# Patient Record
Sex: Male | Born: 1950 | Race: Black or African American | Hispanic: No | Marital: Married | Smoking: Former smoker
Health system: Southern US, Community
[De-identification: ages and names within clinical notes are randomized; demographics above are authoritative.]

## PROBLEM LIST (undated history)

## (undated) DIAGNOSIS — J329 Chronic sinusitis, unspecified: Secondary | ICD-10-CM

## (undated) DIAGNOSIS — Z950 Presence of cardiac pacemaker: Secondary | ICD-10-CM

## (undated) DIAGNOSIS — K219 Gastro-esophageal reflux disease without esophagitis: Secondary | ICD-10-CM

## (undated) DIAGNOSIS — I2510 Atherosclerotic heart disease of native coronary artery without angina pectoris: Secondary | ICD-10-CM

## (undated) DIAGNOSIS — I498 Other specified cardiac arrhythmias: Secondary | ICD-10-CM

## (undated) DIAGNOSIS — I4891 Unspecified atrial fibrillation: Secondary | ICD-10-CM

## (undated) DIAGNOSIS — I10 Essential (primary) hypertension: Secondary | ICD-10-CM

## (undated) DIAGNOSIS — M1990 Unspecified osteoarthritis, unspecified site: Secondary | ICD-10-CM

## (undated) DIAGNOSIS — I429 Cardiomyopathy, unspecified: Secondary | ICD-10-CM

## (undated) DIAGNOSIS — R112 Nausea with vomiting, unspecified: Secondary | ICD-10-CM

## (undated) DIAGNOSIS — I509 Heart failure, unspecified: Secondary | ICD-10-CM

## (undated) DIAGNOSIS — Z9889 Other specified postprocedural states: Secondary | ICD-10-CM

## (undated) DIAGNOSIS — E7800 Pure hypercholesterolemia, unspecified: Secondary | ICD-10-CM

## (undated) DIAGNOSIS — R011 Cardiac murmur, unspecified: Secondary | ICD-10-CM

## (undated) DIAGNOSIS — I456 Pre-excitation syndrome: Secondary | ICD-10-CM

## (undated) DIAGNOSIS — G43909 Migraine, unspecified, not intractable, without status migrainosus: Secondary | ICD-10-CM

## (undated) HISTORY — DX: Atherosclerotic heart disease of native coronary artery without angina pectoris: I25.10

## (undated) HISTORY — DX: Chronic sinusitis, unspecified: J32.9

## (undated) HISTORY — PX: ATRIAL FIBRILLATION ABLATION: EP1191

## (undated) HISTORY — DX: Unspecified atrial fibrillation: I48.91

## (undated) HISTORY — DX: Other specified cardiac arrhythmias: I49.8

## (undated) HISTORY — DX: Essential (primary) hypertension: I10

## (undated) HISTORY — PX: SHOULDER ARTHROSCOPY WITH ROTATOR CUFF REPAIR: SHX5685

## (undated) HISTORY — DX: Pre-excitation syndrome: I45.6

## (undated) HISTORY — PX: CARDIAC CATHETERIZATION: SHX172

## (undated) HISTORY — DX: Cardiomyopathy, unspecified: I42.9

---

## 2003-09-23 ENCOUNTER — Inpatient Hospital Stay (HOSPITAL_COMMUNITY): Admission: EM | Admit: 2003-09-23 | Discharge: 2003-09-25 | Payer: Self-pay | Admitting: *Deleted

## 2005-06-19 ENCOUNTER — Ambulatory Visit: Payer: Self-pay | Admitting: Internal Medicine

## 2005-06-19 ENCOUNTER — Inpatient Hospital Stay (HOSPITAL_COMMUNITY): Admission: AD | Admit: 2005-06-19 | Discharge: 2005-06-27 | Payer: Self-pay | Admitting: Internal Medicine

## 2005-06-19 HISTORY — PX: CARDIOVERSION: SHX1299

## 2005-07-17 ENCOUNTER — Ambulatory Visit: Payer: Self-pay | Admitting: Internal Medicine

## 2005-10-01 ENCOUNTER — Ambulatory Visit: Payer: Self-pay | Admitting: Internal Medicine

## 2006-01-15 ENCOUNTER — Ambulatory Visit: Payer: Self-pay | Admitting: Internal Medicine

## 2006-08-09 ENCOUNTER — Emergency Department (HOSPITAL_COMMUNITY): Admission: EM | Admit: 2006-08-09 | Discharge: 2006-08-09 | Payer: Self-pay | Admitting: Emergency Medicine

## 2006-08-10 ENCOUNTER — Ambulatory Visit: Payer: Self-pay | Admitting: Internal Medicine

## 2006-08-14 ENCOUNTER — Ambulatory Visit: Admission: RE | Admit: 2006-08-14 | Discharge: 2006-08-14 | Payer: Self-pay | Admitting: Internal Medicine

## 2006-12-17 ENCOUNTER — Ambulatory Visit: Payer: Self-pay | Admitting: Internal Medicine

## 2007-06-30 ENCOUNTER — Ambulatory Visit: Payer: Self-pay | Admitting: Internal Medicine

## 2008-06-06 DIAGNOSIS — I429 Cardiomyopathy, unspecified: Secondary | ICD-10-CM

## 2008-06-06 DIAGNOSIS — I498 Other specified cardiac arrhythmias: Secondary | ICD-10-CM | POA: Insufficient documentation

## 2008-06-06 DIAGNOSIS — I10 Essential (primary) hypertension: Secondary | ICD-10-CM

## 2008-06-06 DIAGNOSIS — J329 Chronic sinusitis, unspecified: Secondary | ICD-10-CM

## 2008-06-06 HISTORY — DX: Essential (primary) hypertension: I10

## 2008-06-06 HISTORY — DX: Chronic sinusitis, unspecified: J32.9

## 2008-06-06 HISTORY — DX: Other specified cardiac arrhythmias: I49.8

## 2008-06-06 HISTORY — DX: Cardiomyopathy, unspecified: I42.9

## 2008-06-26 ENCOUNTER — Ambulatory Visit: Payer: Self-pay | Admitting: Internal Medicine

## 2008-06-26 DIAGNOSIS — I4891 Unspecified atrial fibrillation: Secondary | ICD-10-CM

## 2008-06-26 DIAGNOSIS — I456 Pre-excitation syndrome: Secondary | ICD-10-CM | POA: Insufficient documentation

## 2008-06-26 HISTORY — DX: Pre-excitation syndrome: I45.6

## 2008-06-26 HISTORY — DX: Unspecified atrial fibrillation: I48.91

## 2008-12-14 ENCOUNTER — Ambulatory Visit: Payer: Self-pay | Admitting: Internal Medicine

## 2009-08-01 ENCOUNTER — Ambulatory Visit: Payer: Self-pay | Admitting: Internal Medicine

## 2010-02-13 NOTE — Assessment & Plan Note (Signed)
Summary: rov/gd   Visit Type:  Follow-up Primary Provider:  Minna Antis MD   History of Present Illness: [name] [name] returns today for followup.  He is a pleasant 60 yo man with a h/o SVT, atrial flutter and atrial fibrillation who also has WPW syndrome and has undergone multiple episodes of catheter ablation in [place].  He had persistant atrial fibrillation and a tachycardia induced cardiomyopathy but has done very nicely on amiodarone.  His myopathy has resolved and his CHF is class 1-2.  He still has occaisional episodes of palpitations.  No syncope.  He rarely get dyspnea with exertion which resolves with rest.  No cough.  He continues to be bothered by acid reflux symptoms. 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  Current Medications (verified): 1)  Amiodarone Hcl 200 Mg Tabs (Amiodarone Hcl) .... Take One Tablet By Mouth Mon-Fri and 1/2 Tab Sat and Sun 2)  Coreg Cr 20 Mg Xr24h-Cap (Carvedilol Phosphate) .Marland Kitchen.. 1 By Mouth Once Daily 3)  Furosemide 40 Mg Tabs (Furosemide) .... Take One Tablet By Mouth Two Times A Day . 4)  Potassium Chloride Crys Cr 20 Meq Cr-Tabs (Potassium Chloride Crys Cr) .... Take One Tablet By Mouth Daily 5)  Coumadin 6 Mg Tabs (Warfarin Sodium) .... As Directed 6)  Aciphex 20 Mg Tbec (Rabeprazole Sodium) .Marland Kitchen.. 1 By Mouth Once Daily 7)  Cialis .... As Needed 8)  Ativan 0.5 Mg Tabs (Lorazepam) .... Uad 9)  Diovan 160 Mg Tabs (Valsartan) .... Take One Tablet By Mouth Daily  Allergies (verified): No Known Drug Allergies  Past History:  Past Medical History: Last updated: 06/06/2008 ? of RENAL INSUFFICIENCY (ICD-588.9) SINUSITIS (ICD-473.9) HYPERTENSION (ICD-401.9) BRADYCARDIA (ICD-427.89) CARDIOMYOPATHY, SECONDARY (ICD-425.9)  Past Surgical History: Last updated: 12/13/2008  DC cardioversion   DATE OF PROCEDURE:  10/02/2008   PHYSICIAN:  Doylene Canning. Ladona Ridgel, MD    PROCEDURE PERFORMED:  Insertion of a single-chamber defibrillator.    DATE OF PROCEDURE:  08/03/2008   PHYSICIAN:  Quita Skye. Hart Rochester, M.D.  OPERATION:  Left femoral to right femoral bypass using an 8-mm   Hemashield Dacron graft.      DATE OF PROCEDURE:  06/01/2008   PHYSICIAN:  Corky Crafts, MD  PROCEDURES PERFORMED:   1. Abdominal aortogram.   2. Bilateral lower extremity runoff.   3. Pelvic angiogram.      DATE OF PROCEDURE: 07/24/2005  PHYSICIAN:  Georgiana Spinner, M.D.   PROCEDURE:  Upper endoscopy with foreign body removal.  Review of Systems  The patient denies chest pain, syncope, dyspnea on exertion, and peripheral edema.    Vital Signs:  Patient profile:   60 year old male Height:      71 inches Weight:      210 pounds BMI:     29.39  Vitals Entered By: Laurance Flatten CMA (August 01, 2009 1:59 PM)  Serial Vital Signs/Assessments:  Time      Position  BP       Pulse  Resp  Temp     By           Lying RA  140/76                         Jewel Hardy CMA           Sitting   130/80  Jewel Hardy CMA           Standing  148/80                         Laurance Flatten CMA   Physical Exam  General:  Well developed, well nourished, in no acute distress. Head:  normocephalic and atraumatic Eyes:  PERRLA/EOM intact; conjunctiva and lids normal. Mouth:  Teeth, gums and palate normal. Oral mucosa normal. Neck:  Neck supple, no JVD. No masses, thyromegaly or abnormal cervical nodes. Lungs:  Clear bilaterally to auscultation without wheezes, rales, or rhonchi. Heart:  RRR with normal S1 and S2.  PMI is not enlarged or laterally displaced. No murmur. Abdomen:  Bowel sounds positive; abdomen soft and non-tender without masses, organomegaly, or hernias noted. No hepatosplenomegaly. Msk:  Back normal, normal gait. Muscle strength and tone normal. Pulses:  pulses normal in all 4 extremities Extremities:  No clubbing or cyanosis. Neurologic:  Alert and oriented x  3.   EKG  Procedure date:  08/01/2009  Findings:      Sinus bradycardia with rate of:  55.  Impression & Recommendations:  Problem # 1:  ATRIAL FIBRILLATION (ICD-427.31) His symptoms remain well controlled.  He is in NSR.  I will ask him to followup with [place] in several months. We will further reduce his coumadin dose. His updated medication list for this problem includes:    Amiodarone Hcl 200 Mg Tabs (Amiodarone hcl) .Marland Kitchen... Take one tablet by mouth mon-thurs and 1/2 tab fri- sun    Coreg Cr 20 Mg Xr24h-cap (Carvedilol phosphate) .Marland Kitchen... 1 by mouth once daily    Coumadin 6 Mg Tabs (Warfarin sodium) .Marland Kitchen... As directed  Problem # 2:  WOLFF (WOLFE)-PARKINSON-WHITE (WPW) SYNDROME (ICD-426.7)  No recurrent symptoms on amiodarone. His updated medication list for this problem includes:    Amiodarone Hcl 200 Mg Tabs (Amiodarone hcl) .Marland Kitchen... Take one tablet by mouth mon-fri and 1/2 tab sat and sun    Coreg Cr 20 Mg Xr24h-cap (Carvedilol phosphate) .Marland Kitchen... 1 by mouth once daily    Coumadin 6 Mg Tabs (Warfarin sodium) .Marland Kitchen... As directed  Problem # 3:  CARDIOMYOPATHY, SECONDARY (ICD-425.9) His symptoms remain well controlled.  Continue meds as below His updated medication list for this problem includes:    Amiodarone Hcl 200 Mg Tabs (Amiodarone hcl) .Marland Kitchen... Take one tablet by mouth mon-thurs and 1/2 tab fri- sun    Coreg Cr 20 Mg Xr24h-cap (Carvedilol phosphate) .Marland Kitchen... 1 by mouth once daily    Furosemide 40 Mg Tabs (Furosemide) .Marland Kitchen... Take one tablet by mouth two times a day .    Coumadin 6 Mg Tabs (Warfarin sodium) .Marland Kitchen... As directed    Diovan 160 Mg Tabs (Valsartan) .Marland Kitchen... Take one tablet by mouth daily  Patient Instructions: 1)  Your physician recommends that you schedule a follow-up appointment in: 12 months with Dr Ladona Ridgel 2)  Your physician has recommended you make the following change in your medication: decrease your Amiodarone to 1/2 tablet Fri-Sun, and one tablet mon-Thurs

## 2010-05-28 NOTE — Assessment & Plan Note (Signed)
[facility]                         ELECTROPHYSIOLOGY OFFICE NOTE   NAME:[name] [name], [name] [name]                     MRN:          [id number]  DATE:06/30/2007                            DOB:          17-Jan-1950    [name] returns today for a followup.  He is a pleasant, middle-aged  male with a history of WPW syndrome status post multiple ablation  attempts and history of atrial arrhythmias, who has been maintained very  nicely in sinus rhythm with no evidence of accessory pathway conduction,  on amiodarone therapy.  He returns today for a followup.  He notes that  he has rare palpitations, which occur when he drinks cold water, but  otherwise no sustained arrhythmias.  He denies chest pain or shortness  of breath.   MEDICATIONS:  1. Amiodarone 200 mg daily.  2. Aciphex 20 a day.  3. Furosemide 40 twice a day.  4. Potassium supplement.  5. Coreg CR 40 a day.  6. Ramipril 5 a day.  7. Jantoven 6 mg daily.   PHYSICAL EXAMINATION:  GENERAL:  This is a pleasant, well-appearing  middle-aged man, in no distress.  VITAL SIGNS:  Blood pressure 138/80, the pulse is 52 and regular,  respirations is 18, the weight is 216 pounds.  NECK:  No jugular distention.  LUNGS:  Clear bilaterally to auscultation.  No wheezes, rales, or  rhonchi are present.  CARDIAC:  Regular bradycardia.  Normal S1 and S2.  ABDOMEN:  Soft, nontender, and nondistended.  No organomegaly.  EXTREMITIES:  No edema.   EKG today demonstrates sinus bradycardia with first-degree AV block and  left axis deviation.   IMPRESSION:  1. Cardiomyopathy secondary to symptomatic Wolff-Parkinson-White      syndrome, now resolved.  2. Chronic amiodarone therapy.  3. Sinus bradycardia.  4. Hypertension.   DISCUSSION:  [name] is stable.  I have recommended that he continue  on his current dose of amiodarone, and we will see him back in the  office in 1 year.     Doylene Canning. Ladona Ridgel, MD  Electronically Signed    GWT/MedQ  DD: 06/30/2007  DT: 07/01/2007  Job #: [id number]

## 2010-05-28 NOTE — Assessment & Plan Note (Signed)
[facility]                         ELECTROPHYSIOLOGY OFFICE NOTE   NAME:[name] [name], [name] [name]                     MRN:          [id number]  DATE:12/17/2006                            DOB:          1950-02-10    [name] returns today for follow-up.  He is a very pleasant middle-  aged man with a history of WPW syndrome, status post multiple failed  ablations, with a history of atrial fibrillation and atrial flutter,  status post amiodarone therapy.  The patient returns today for follow-  up.  He has had very minimal, if any, palpitations in the last 6 months  since we saw him.  He has been under increasing stress but otherwise has  been stable.   EXAM:  He is a pleasant, well-appearing middle-aged man in no acute  distress.  Blood pressure 138/83, the pulse 67 and regular, respirations were 18,  the weight was 219 pounds.  NECK:  No jugular venous distention.  LUNGS:  Clear bilaterally to auscultation.  No wheezes, rales or rhonchi  were present.  CARDIOVASCULAR:  A regular rate and rhythm with normal S1, S2.  EXTREMITIES:  No edema.   MEDICATIONS:  1. Furosemide 40 mg twice a day.  2. Coumadin 6 mg as directed.  3. Trandolapril, which has been stopped.  4. Aciphex 20 mg daily.  5. Amiodarone 200 mg.  6. Coreg CR 40 mg daily.  7. Micardis 40 mg daily.  8. Potassium 20 mEq daily.   IMPRESSION:  1. Wolff-Parkinson-White syndrome.  2. Recurrent atrial fibrillation.  3. Atrial flutter.  4. Amiodarone therapy controlling all of the above.   DISCUSSION:  [name] is doing well.  His EKG today demonstrates no  delta wave or evidence of pre-excitation.  He will see [place] back in the  office in 6 months.  At that time we may consider additional decrement  of his amiodarone dosing down to 200 mg daily.     Doylene Canning. Ladona Ridgel, MD  Electronically Signed    GWT/MedQ  DD: 12/17/2006  DT: 12/18/2006  Job #: [id number]   cc:   Minna Antis, MD  Dallie Dad, MD

## 2010-05-28 NOTE — Assessment & Plan Note (Signed)
[facility]                         ELECTROPHYSIOLOGY OFFICE NOTE   NAME:Lewelling, [name] [name]                     MRN:          [id number]  DATE:08/10/2006                            DOB:          [birth year]    [name] [name] returns today for followup.  He is a very pleasant middle-  aged man with WPW syndrome and atrial fibrillation which could not be  ablated.  The patient returns today for followup.  He notes that he has  occasional cough and does feel some fatigue and weakness.  Otherwise, he  had no specific complaints.  He denies chest pain.  He denies peripheral  edema.  He has very mild dyspnea with exertion and he notes an  occasional nonproductive cough.   PHYSICAL EXAM:  He is a pleasant, well-appearing middle-aged man in no  acute distress.  The blood pressure was 146/91, the pulse was 60 and  regular with respirations 16.  NECK:  No jugular venous distention.  LUNGS:  Clear bilaterally to auscultation.  No wheezes, rales or  rhonchi.  CARDIOVASCULAR EXAM:  A regular rate and rhythm with normal S1 and S2.  EXTREMITIES:  Demonstrated no edema.   An EKG from yesterday demonstrates sinus rhythm with normal axis and  intervals.  There is no ventricular preexcitation noted.   IMPRESSION:  1. Wolff-Parkinson-White syndrome.  2. Paroxysmal atrial arrhythmias.  3. Status post attempt at multiple catheter ablations.   DISCUSSION:  Overall the patient is stable.  His heart is maintaining  sinus rhythm very nicely.  He will continue on his amiodarone at 200 a  day along with Coreg 20 mg twice daily.  He will also continue on  Aciphex and trandolapril and Coumadin.  Will plan to see the patient  back in the office in approximately 4 months.     Doylene Canning. Ladona Ridgel, MD  Electronically Signed    GWT/MedQ  DD: 08/10/2006  DT: 08/11/2006  Job #: [id number]

## 2010-05-31 NOTE — Op Note (Signed)
[name] [name], [name] [name] NO.:  [id number]   MEDICAL RECORD NO.:  [id number]          PATIENT TYPE:  INP   LOCATION:  2915                         FACILITY:  [facility]   PHYSICIAN:  Doylene Canning. Ladona Ridgel, M.D.  DATE OF BIRTH:  26-Mar-1950   DATE OF PROCEDURE:  06/19/2005  DATE OF DISCHARGE:                                 OPERATIVE REPORT   PROCEDURE PERFORMED:  DC cardioversion.   INDICATIONS:  A fib with a rapid ventricular response secondary to WPW  syndrome with maximal ventricular pre-excitation.   HISTORY OF PRESENT ILLNESS:  The patient is a very pleasant middle-aged male  with a history of long longstanding WPW syndrome, atrial fibrillation and  atrial flutter who has had three catheter ablations for his A fib and WPW  syndrome.  He was recently hospitalized and underwent attempted catheter  ablation which was initially thought to be successful but with the patient  returning back to preexcitation and A fib with a rapid ventricular response.  The patient will was seen in our office today and was in atrial fibrillation  with a rapid ventricular response at 185 beats per minute with maximal  ventricular pre-excitation.  The closest coupling interval was approximately  220 msec. He is now referred for  DC cardioversion.  It is anticipated that  because of his difficult anatomy that he will be treated with medical  therapy to try to maintain him in sinus rhythm.   DESCRIPTION OF PROCEDURE:  After informed consent was obtained, the patient  was sedated in the CCU with sodium Pentothal.  200 joules of synchronized  biphasic energy were delivered through the electrode dispersive pads in the  anterior-posterior projection restoring sinus rhythm.  The patient tolerated  the procedure well.   COMPLICATIONS:  There were no immediate procedure complications.   RESULTS:  This demonstrates a successful DC cardioversion in a patient with  atrial fibrillation, a rapid atrial  response, and WPW syndrome with maximal  ventricular pre-excitation.           ______________________________  Doylene Canning. Ladona Ridgel, M.D.     GWT/MEDQ  D:  06/19/2005  T:  06/20/2005  Job:  [id number]   cc:   Dallie Dad, MD   Dr. Fabian November, [place], [place]

## 2010-05-31 NOTE — H&P (Signed)
NAME:  [name] [name], [name] [name] NO.:  [id number]   MEDICAL RECORD NO.:  [id number]                   PATIENT TYPE:  EMS   LOCATION:  MAJO                                 FACILITY:  [facility]   PHYSICIAN:  Vida Roller, M.D.                DATE OF BIRTH:  Jul 17, 1950   DATE OF ADMISSION:  09/23/2003  DATE OF DISCHARGE:                                HISTORY & PHYSICAL   PHYSICIANS:  Primary care physician:  Dr. Pleas Patricia, [facility]  Cardiologist:  Dr. Dallie Dad, [place], [place].   HISTORY OF PRESENT ILLNESS:  This is a 60 year old African-American  gentleman with a history of Wolff-Parkinson-white and history of atrial  fibrillation.  He presents with sudden onset of palpitations and shortness  of breath with dizziness at approximately 3 o'clock this morning.  He is  visiting here on a convention and presented to the emergency department here  at [facility], was found to be in atrial fibrillation with rapid  ventricular response, hemodynamically stable, and we were asked to evaluate  him.  He states that over the past two weeks he has felt kind of poorly and  had an upper respiratory infection.  His primary care Charrise Lardner put him on  amoxicillin and a decongestant medication called Furadex which appears to be  dextromethorphan and guaifenesin.  Also, two other medications that she does  not have with her, one of which was for dizziness.  The wife actually gave  this history.  He states that the onset of the symptoms was not associated  with any chest pain, diaphoresis, PND, or orthopnea.   PAST MEDICAL HISTORY:  Wolff-Parkinson-white.  He is status post a failed  ablation by the electrophysiology group at [facility] in  [place].  He stat4es that he had a structurally abnormal heart and that  they could not get to the area where the bypass track was likely to be and,  therefore, they abandoned the procedure.  He was  placed on verapamil and  flecainide and has taken those medications consistently for the last couple  of years without any difficulties.  He had an episode of atrial fibrillation  which brought him into prominence with the cardiology group.  He required DC  cardioversion in the past, and this is when they discovered his Evelene Croon-  Parkinson-white.  He is not anticoagulated.  He has a history of congenital  heart disease which is not well recorded, and I do not have records on this,  and he does not necessarily describe any particularly clear history of  abnormality other than he says his valves do not work normally.  He is not  limited in any exertionally, though.  He also has ectopic atrial rhythm  currently on EKG with a mild tachycardia.   He does not have any past surgical history.   ALLERGIES:  He is not allergic to any  medications.   SOCIAL HISTORY:  He does not smoke, drink, or use any illicit drugs.  He is  married.   FAMILY HISTORY:  Significant for his father dying of myocardial infarction  at a relatively young age.  He has also history of diabetes and hypertension  in his family.   REVIEW OF SYSTEMS:  Generally negative except for that reviewed in the  History of Present Illness.   PHYSICAL EXAMINATION:  GENERAL:  Well-developed, well-nourished African-  American male in no apparently distress.  He is alert and oriented x 4.  VITAL SIGNS:  Pulse 120, blood pressure 117/80, respiratory rate 18.  He is  afebrile.  HEAD, EYES, EARS, NOSE, AND THROAT:  Unremarkable.  NECK:  Supple.  There is no jugular venous distention or carotid bruits.  Thyroid is normal size and midline.  ABDOMEN: Soft.  CHEST:  Clear to auscultation.  CARDIAC:  Tachycardic but regular with no obvious murmur.  His point of  maximal impulse is not displaced, and he has no lifts or thrills.  EXTREMITIES:  Lower extremities are without significant clubbing, cyanosis,  or edema.  His pulses are 2+  throughout.   LABORATORY AND X-RAY DATA:  He has a bunch of electrocardiograms, the most  recent of which shows an ectopic atrial tachycardia at a rate of 123 with a  PR interval of 144 msec, QRS duration 90 msec, QT corrected at 460 msec.  He  has electrocardiographic evidence of left ventricular hypertrophy.  The P  wave morphology is negative in leads II, III, and F, but positive in lead I.  He has no obvious delta wave, although there is some up slurring of the  lateral precordial leads.  He previously had EKGs which showed him in atrial  fibrillation.   Laboratories are pending.   Chest x-ray is pending.   IMPRESSION:  This is a gentleman with a complex electrophysiology problem,  currently on two medications.  I have discussed this gentleman's case with  Dr. Lewayne Bunting to come up with a plan.  He recommends that we not  anticoagulate the patient at this time, that we slow his heart rate down  with some IV beta blocker, that we continue his flecainide and verapamil as  previously, and follow him over a 24-hour period.  If he converts to sinus  rhythm, then the plan would be to discharge him back to the care of his  cardiologist.  If he does not convert, the consideration may be made for DC  cardioversion versus consideration for an EP evaluation.                                                Vida Roller, M.D.    JH/MEDQ  D:  09/23/2003  T:  09/23/2003  Job:  [id number]   cc:   Dr. Pleas Patricia, [place]   Dallie Dad, M.D.  [place], [place]

## 2010-05-31 NOTE — Assessment & Plan Note (Signed)
[facility]                         ELECTROPHYSIOLOGY OFFICE NOTE   NAME:[name] [name], [name] [name]                     MRN:          [id number]  DATE:01/15/2006                            DOB:          14-May-1950    Mr. Holzman returns today for followup.  He is a very pleasant, middle-  aged man with WPW syndrome, atrial fibrillation, atrial flutter status  post multiple ablations.  He also has hypertension.  The patient has  done very nicely over the last several months on amiodarone therapy.  We  initially had him on 400 mg daily and decreased him to 200 mg a day back  in the summer.  He notes rare episodes of palpitations none of which  have been sustained.  His shortness of breath has resolved.  He does  have occasional abdominal bloating but ascribes this to be due to  anxiousness and nervousness.  His medications include potassium  furosemide, Coumadin, trandolapril, amiodarone, 200 a day, Coreg CR 20 a  day.   PHYSICAL EXAMINATION:  On exam he is a pleasant, well-appearing, middle-  aged man in no acute distress.  His blood pressure today was 138/84, the  pulse was 58 and regular. The respirations were 18, the weight was 221  pounds.  NECK:  Revealed no jugular venous distention.  LUNGS:  Clear bilaterally to auscultation and no wheezes, rales or  rhonchi.  CARDIOVASCULAR EXAM:  Revealed a respiratory rate with normal S1 and S2.  EXTREMITIES:  Demonstrated no edema.   The EKG demonstrates sinus rhythm with first degree AV block.  There is  LVH, but no evidence of any pre-excitation.   IMPRESSION:  1. Wolff-Parkinson-White syndrome.  2. Tachycardia induced cardiomyopathy, not resolved.  3. Atrial fibrillation and flutter.  4. Amiodarone therapy.  5. Chronic Coumadin therapy.   DISCUSSION:  Today, while I had initially had hoped to decrease Mr.  Limbaugh, amiodarone further, I have recommended that he continue on 200  mg daily as it appears to  be controlling his arrhythmia very nicely.  The patient has been on Coreg CR and I have given him a prescription to  switch him from Coreg CR to Coreg 12.5 twice a day in the generic  version secondary to cost.  I will plan to see him back in approximately  6 months for additional arrhythmia followup.     Doylene Canning. Ladona Ridgel, MD  Electronically Signed   GWT/MedQ  DD: 01/15/2006  DT: 01/15/2006  Job #: [id number]   cc:   Minna Antis, MD  Dallie Dad, MD

## 2010-05-31 NOTE — Discharge Summary (Signed)
[name] [name], [name] [name] NO.:  [id number]   MEDICAL RECORD NO.:  [id number]          PATIENT TYPE:  INP   LOCATION:  3714                         FACILITY:  [facility]   PHYSICIAN:  Maple Mirza, P.A. DATE OF BIRTH:  1950/03/23   DATE OF ADMISSION:  06/19/2005  DATE OF DISCHARGE:  06/27/2005                                 DISCHARGE SUMMARY   ALLERGIES:  DIGOXIN/CARDIZEM/PROTONIX.   PRINCIPAL DIAGNOSES:  1.  Admitted with atrial fibrillation rapid ventricular rate and concurrent      decompensation of congestive heart failure.      1.  Aggressive intravenous Lasix diuresis this admission.  2.  Severe congestive heart failure.      1.  Echocardiogram with ejection fraction 20%.      2.  Moderate mitral regurgitation.  Mitral valve area is 0.33 square          centimeters.      3.  Inferior and posterior walls are akinetic, lateral wall hypokinetic.  3.  When compensated, the patient has class II congestive heart failure.  4.  Status post DC cardioversion June 19, 2005 with relapse into paroxysmal      atrial fibrillation.  5.  Amiodarone therapy initiated this admission.      1.  Paroxysmal atrial fibrillation extinguished by day six after          initiation of amiodarone.  6.  Readjusted Coumadin dose this admission to account for amiodarone.  7.  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  8.  Cellulitis in left arm at infiltration of IV.  Oral antibiotics x8 days.   SECONDARY DIAGNOSES:  1.  Wolff-Parkinson-White status post failed ambulation attempts.  2.  Chronic nausea and gastrointestinal distress.   PLAN:  1.  Home on Lasix 40 mg b.i.d.  2.  Home on amiodarone with taper.  3.  Home on Keflex 500 mg q.i.d. x8 days.  4.  Starting Coreg 6.25 mg b.i.d. for severe cardiomyopathy of unclear      etiology.  5.  Mavik 1 mg daily for congestive heart failure.  6.  Followup office visit with Dr. Ladona Ridgel Thursday, July 5 at 10:45 with      BMET.  7.   Followup echocardiograms to watch for a rebound in ejection fraction.  8.  Have requested previous echocardiograms from [facility] and      other information relevant to this case.   PROCEDURE:  On admission, June 19, 2005, DC cardioversion with early relapse  into paroxysmal atrial fibrillation.   BRIEF HISTORY:  [name] [name] is a 60 year old male.  He has longstanding WPW  syndrome.  He has atrial fibrillation and atrial flutter.  He has had three  catheter ablations for his atrial fib and WPW syndrome.  He was recently  hospitalized and catheter ablation was attempted.  Initially this was felt  to be successful and the patient is returned back to pre-excitation and also  with atrial fibrillation with rapid ventricular response.  The patient was  seen in the office by Dr. Lewayne Bunting on June 7.  He was in  atrial  fibrillation with rapid response with maximal ventricular pre-excitation.  He is referred now for DC cardioversion.  In addition, medical therapy will  be instituted to maintain sinus rhythm.   HOSPITAL COURSE:  The patient presents June 7 for DC cardioversion.  This  was performed by Dr. Lewayne Bunting.  The patient did convert to sinus rhythm,  but reverted to paroxysms which were rather frequent of atrial fibrillation  within 48 hours.  The patient had a 2D echocardiogram on June 20, 2005.  This  study showed an ejection fraction of 20%.  This is rather surprising.  He  had moderate mitral valvular regurgitation.  Left atrium was dilated.  Right  ventricle moderately dilated.  The inferior and posterior walls are  akinetic.  The lateral wall shows marked hypokinesis.  The patient was  started on amiodarone therapy, both IV and p.o. on admission and throughout  this hospitalization we could actually document that his paroxysms of atrial  fibrillation have slowed over each 24 hour period until they finally were  extinguished by hospital day #7 as amiodarone completed  its loading.  During  this hospitalization, the patient did demonstrate overt decompensation of  congestive heart failure, not so much in breathing, but in the lower  extremities.  It was thought actually that the lower extremities were so  swollen and painful that perhaps he was having a DVT, although his Coumadin  was also supratherapeutic to therapeutic.  The patient improved rapidly  after institution of IV Lasix 40 mg b.i.d. and to such an extent that  swelling in the extremities has now subsided and symptoms of pain and  distention that the patient has been experiencing are now gone by the time  of discharge.  Because of amiodarone therapy, his Coumadin dose has been  revised.  Prior to this admission, he was on 10 mg Monday, Wednesday,  Friday, and 7.5 mg every other day.  At the time of discharge, he will be on  7.5 mg daily.  The patient did develop a left upper extremity cellulitis  after infiltration of IV and he has been treated with IV Ancef and will go  home on oral Keflex medication.  Because of the overt evidence of congestive  heart failure and decompensation and the evidence by echocardiogram which  points to a possible tachycardia mediated etiology.  The patient's  medications have been adjusted.   DISCHARGE MEDICATIONS:  1.  Lasix 40 mg twice daily.  2.  Potassium chloride 40 mEq daily.  These are both new.  3.  Amiodarone.  This is new, 200 mg tabs.  Two tabs in the morning and two      tabs in the evening from June 15 to June 29.  Two tabs daily from June      30 to July 14, and then 1-1/2 tablets daily starting July 15.  4.  Coumadin 7.5 mg daily or as directed by [facility].  5.  AcipHex 20 mg daily.  6.  Keflex 500 mg four times daily x8 days.  He is to take this 1/2 hour      before breakfast, lunch, dinner, and bedtime.  7.  Coreg 6.25 mg twice daily.  This is a new medication.  8.  Mavik 1 mg daily.  This is new.  He is to present to the [facility] on Monday for prothrombin  time, and he will see Dr. Ladona Ridgel at [facility] [address]  [place]  Thursday, July 17, 2005 at 10:45.  Once again medical records have been  requested from [facility], especially any prior echocardiograms  and electrocardiograms.  The patient is to weight himself daily and to get  in touch with [facility] if he gains 3 pounds in two days.   This takes greater than 40 minutes to prepare and dictate.      Maple Mirza, P.A.     GM/MEDQ  D:  06/27/2005  T:  06/27/2005  Job:  [id number]   cc:   Doylene Canning. Ladona Ridgel, M.D.  [address]  [address]  [place]  [place] [address]   [facility]  Fax:  [phone]   Minna Antis, M.D.

## 2010-05-31 NOTE — Discharge Summary (Signed)
NAMEFINLEE, MILO NO.:  [id number]   MEDICAL RECORD NO.:  [id number]          PATIENT TYPE:  INP   LOCATION:  3714                         FACILITY:  [facility]   PHYSICIAN:  Maple Mirza, P.A. DATE OF BIRTH:  Jun 03, 1950   DATE OF ADMISSION:  06/19/2005  DATE OF DISCHARGE:  06/27/2005                                 DISCHARGE SUMMARY   ADDENDUM:   LABORATORY STUDIES:  Complete blood count on June 25, 2005: Hemoglobin 14.9,  hematocrit 43.9, white cells 8.8, platelets 300.  Serum electrolytes on the  day of discharge, June 27, 2005: Sodium 138, potassium 3.9, chloride 97,  carbonate 32, BUN is 10, creatinine 1.4, glucose is 91.  A PT/INR on the day  of discharge is 22, PT/INR is 1.9.  It would have been interesting to have  some BMP results, but they were not obtained on admission.      Maple Mirza, P.A.     GM/MEDQ  D:  06/27/2005  T:  06/27/2005  Job:  [id number]

## 2010-05-31 NOTE — Consult Note (Signed)
[name] [name], [name] [name]                        ACCOUNT NO.:  [id number]   MEDICAL RECORD NO.:  [id number]                   PATIENT TYPE:  INP   LOCATION:  2901                                 FACILITY:  [facility]   PHYSICIAN:  Doylene Canning. Ladona Ridgel, M.D.               DATE OF BIRTH:  [birth year]   DATE OF CONSULTATION:  09/25/2003  DATE OF DISCHARGE:                                   CONSULTATION   REQUESTING PHYSICIAN:  Dr. Juanito Doom   INDICATION FOR CONSULTATION:  Evaluation of a patient with a history of WPW  syndrome as well as atrial flutter.   HISTORY OF PRESENT ILLNESS:  The patient is a 60 year old man who was  admitted to the hospital with chest discomfort which was very minimal  associated with rapid heart rates.  EKG demonstrates atrial flutter with 2:1  AV conduction and a ventricular rate of 120-125 beats per minute.  The  patient was admitted for additional evaluation.  He ultimately converted  back to sinus rhythm spontaneously.   PAST MEDICAL HISTORY:  1.  History of WPW syndrome by his report with an apparent unsuccessful      catheter ablation approximately four years ago down in [place] by Dr.      Areta Haber.  Since then the patient has been treated with flecainide      and verapamil and done reasonably well, although he does occasionally      have recurrent tachy palpitations and dizzy spells.  He denies any frank      syncope.  2.  Hypertension.  3.  There is also a question of renal insufficiency.  4.  Frequent episodes of sinusitis.   FAMILY HISTORY:  Notable for a father dying age 48 of coronary disease and a  mother who is otherwise healthy.   SOCIAL HISTORY:  The patient has a history of tobacco use, but stopped  smoking 20 years ago.  He denies alcohol use.   REVIEW OF SYSTEMS:  Negative for occasional dizzy spells, but no vision or  hearing problems.  He denies nausea, vomiting, diarrhea, constipation.  He  denies cough or hemoptysis.  He denies chest  pain or shortness of breath  except with his rapid heartbeats.  He denies nausea, vomiting, diarrhea,  constipation.  He denies polyuria, polydipsia, heat or cold intolerance, or  arthritic complaint.   PHYSICAL EXAMINATION:  GENERAL:  He is a pleasant, well-appearing middle  aged man in no distress.  VITAL SIGNS:  Blood pressure 100/60, respirations 18, weight 220 pounds,  pulse 72.  HEENT:  Normocephalic, atraumatic.  Pupils equal and round.  Oropharynx was  moist.  Sclerae were anicteric.  NECK:  No jugular venous distention.  There is no thyromegaly.  The trachea  was midline.  The carotids were 2+ and symmetric.  LUNGS:  Clear bilaterally to auscultation.  CARDIOVASCULAR:  Regular rate and rhythm with normal S1 and  S2.  EXTREMITIES:  No edema.  SKIN:  Without lesions.  NEUROLOGIC:  Notable for cranial nerves II-XII being grossly intact.  Strength 5/5 and symmetric.   EKG demonstrates normal sinus rhythm with no overt ventricular  preexcitation.  EKGs in atrial flutter demonstrates typical atrial flutter  with variable, but mostly 2:1 AV conduction.  The flow rate morphology was  negative in the inferior leads and positive in AV1 consistent with typical  counter clockwise atrial flutter.   IMPRESSION:  1.  WPW syndrome with status post failed catheter ablation four years ago.  2.  Atrial flutter.  3.  Hypertension.  4.  Chronic flecainide and verapamil therapy secondary to his atrial      arrhythmias.   DISCUSSION:  I discussed the treatment options with [name] [name].  I think  catheter ablation for his atrial flutter, perhaps in conjunction with a  repeat ablation attempt on his accessory pathway is warranted to prevent him  from a life long on Coumadin therapy.  I have discussed the treatment  options with the patient and he would like to consider his options.  Will  plan to discharge the patient home later today and give him instructions to  follow up with his primary  cardiologist who is Dr. Dallie Dad in  [place].  Should he desire to return to [facility] for his catheter  ablation, we can certainly accompany this and his numbers have been called  should he like to proceed in this direction.                                               Doylene Canning. Ladona Ridgel, M.D.    GWT/MEDQ  D:  09/25/2003  T:  09/25/2003  Job:  [id number]   cc:   Dallie Dad, M.D.  [place], [place]   Otelia Santee, M.D.  [place], [place]   Bennie Pierini, M.D.  [facility], [place]

## 2010-05-31 NOTE — Discharge Summary (Signed)
NAMEHARCE, VOLDEN              ACCOUNT NO.:  [id number]   MEDICAL RECORD NO.:  [id number]          PATIENT TYPE:  INP   LOCATION:                               FACILITY:  [facility]   PHYSICIAN:  Artist Beach, MD        DATE OF BIRTH:  09/12/1950   DATE OF ADMISSION:  09/23/2003  DATE OF DISCHARGE:  09/25/2003                                 DISCHARGE SUMMARY   DISCHARGE DIAGNOSES:  1.  Wolff-Parkinson-White syndrome, status post ablation.  2.  Hypertension.  3.  Atrial flutter.  4.  Acute renal failure.   CONSULTATIONS:  The patient has had an EP consult by Dr. Ladona Ridgel. The patient  had an EKG that showed left axis deviation, ST elevation with early  repolarization, nonspecific T-wave abnormalities. Chest x-ray was clear.   HISTORY OF PRESENT ILLNESS:  Mr. Housey is a 60 year old African American  gentleman with a history of WPW syndrome and atrial fibrillation, status  post attempted ablation. He presented to the ER with complaint of sudden  onset of palpitation and shortness of breath with dizziness which woke him  up at around 3:00 on the day of admission. The patient was in [place] at  a convention. On admission he was found to be in atrial fibrillation with  rapid ventricular response, hemodynamically stable. The patient gave a  history of upper respiratory tract infection a week prior to admission. He  was put on amoxicillin, a decongestant, and guaifenesin for this. The  patient did not complain of any chest pain, diaphoresis, PND, or orthopnea.   On examination temperature was normal, pulse 120, blood pressure 117/88,  respiratory rate 18. Cardiovascular exam revealed tachycardia, no murmurs  auscultated. No lifts or thrills auscultated. Respiratory exam was clear to  auscultation bilaterally. Abdomen was soft and nontender, bowel sounds  present.   LABORATORY ON ADMISSION:  Hemoglobin 15.7, white count 4.5.  Sodium 138,  potassium 3.5, chloride 103, bicarbonate 27,  BUN 16, creatinine 1.3, glucose  146. Liver enzymes are normal. Cardiac enzymes times three were negative.  INR was 1. Magnesium was 2. BNP was less than 30.   HOSPITAL COURSE:   PROBLEM #1:  Atrial fibrillation with rapid ventricular response. The  patient was admitted to rule out ischemia or any septic causes leading to  stress causing atrial fibrillation. The patient was put on his home  medication of verapamil and flecainide. He was also started on Lopressor for  rate control. No anticoagulation was added to his regimen. After 24 hours of  observation the patient did respond to medical therapy. Ablation treatment  was considered during his admission, but from the notes from prior attempt  at ablation it seemed that there was some anatomical problem approaching  this site; hence, that was not considered at that time. It was left upon his  primary  cardiologist, Dr. Ferd Glassing, in [place]. The patient was put on Lovenox and  Coumadin for anticoagulation with INR ranging between 2 and 3.  The  patient's INR ranged between 2 and 3.  The patient was discharged on normal  sinus rhythm on flecainide, verapamil, Coumadin, and Lovenox, in a stable  condition.           ______________________________  Artist Beach, MD     SP/MEDQ  D:  07/15/2004  T:  07/15/2004  Job:  [id number]   cc:   Dr. Waynard Edwards, [place]   Dallie Dad, M.D.  [place], [place]

## 2010-05-31 NOTE — Assessment & Plan Note (Signed)
[facility]                           ELECTROPHYSIOLOGY OFFICE NOTE   NAME:[name], [name] LARES                     MRN:          [id number]  DATE:10/01/2005                            DOB:          1950-11-26    [name] [name] returns today for followup.  He is a very pleasant middle-aged  male with WPW syndrome, recurrent atrial arrhythmia status post multiple  catheter ablations who we saw back in June and at that time he had developed  a tachycardia induced cardiomyopathy and was in A-fib with a rapid  ventricular response with fairly maximal ventricular preexcitation.  He was  hospitalized and underwent initiation of amiodarone therapy followed by DC  cardioversion restoring sinus rhythm.  He has been in sinus rhythm since  then.  The patient reports that he underwent repeat 2D echo under the  direction of Dr. Andrey Campanile which reportedly demonstrated return to normal of  his LV function.  I do not have the details of this, though it they would be  nice to have.  The patient denies chest pain or shortness of breath.  He  does note that on amiodarone he has had some visual changes with some  haziness particularly at night time, however despite this he feels well.   His medications include potassium, furosemide, Coumadin, amiodarone 200  daily, Coreg CR 20 daily, and Aciphex.  He is also on trandolapril 2 mg 1/2  tablet daily.   PHYSICAL EXAMINATION:  GENERAL:  He is a pleasant well appearing, middle-  aged man in no acute distress.  VITAL SIGNS:  Blood pressure today was 136/78, pulse was 55 and regular.  The respirations are 18 and the weight was 212 pounds.  NECK:  Revealed no jugular venous distension.  LUNGS:  Clear bilateral to auscultation.  There are no wheezes, rales, or  rhonchi.  CARDIOVASCULAR: Regular rate and rhythm with a soft S4 gallop.  EXTREMITIES:  Demonstrated no edema.   The EKG demonstrated sinus bradycardia with LVH and no  evidence of  ventricular preexcitation on amiodarone.   IMPRESSION:  1. Tachycardia induced cardiomyopathy.  2. WPW syndrome, status post multiple catheter ablations.  3. Recurrent atrial arrhythmias, despite catheter ablation.   DISCUSSION:  Overall [name] [name] is stable on 200 a day of amiodarone.  Because he became so ill on amiodarone, my inclination is to continue him on  200 mg daily for the next 3-4 months.  If he continues to maintain sinus  rhythm as I expect he will, we will plan to decrease his amiodarone dose to  200 mg during the week and 100 mg on the weekend with hopeful gradual  decrease in his overall amiodarone dosing.  He will continue on his present  medications.  The patient has in the past been on Coreg and I do wonder if  rather than being on once a day Coreg, he would tolerate and be able to  afford easier the twice a day generic version of this drug.  Ultimately I  will deferred the decision about what to do on this to Dr. Minna Antis in  [place] and to Dr. Dallie Dad in [place], [place].  I will  plan to see the patient  back in January or February of 2008 and he is instructed to call should he  have additional problems or concerns.                                   Doylene Canning. Ladona Ridgel, MD   GWT/MedQ  DD:  10/01/2005  DT:  10/03/2005  Job #:  [id number]   cc:   Minna Antis, MD  Dallie Dad, MD

## 2010-05-31 NOTE — Discharge Summary (Signed)
NAMEMCGREGOR, TINNON NO.:  [id number]   MEDICAL RECORD NO.:  [id number]          PATIENT TYPE:  INP   LOCATION:  3714                         FACILITY:  [facility]   PHYSICIAN:  Maple Mirza, P.A. DATE OF BIRTH:  Dec 12, 1950   DATE OF ADMISSION:  06/19/2005  DATE OF DISCHARGE:  06/27/2005                                 DISCHARGE SUMMARY   ADDENDUM:   NOTE:  This patient may be a candidate for the CRT-6HF trial if ejection  fraction does not improve over time with medical therapy.      Maple Mirza, P.A.     GM/MEDQ  D:  06/27/2005  T:  06/27/2005  Job:  [id number]   cc:   Doylene Canning. Ladona Ridgel, M.D.  [address]  [address]  [place]  [place] [address]

## 2010-06-20 ENCOUNTER — Encounter: Payer: Self-pay | Admitting: Internal Medicine

## 2010-07-25 ENCOUNTER — Encounter: Payer: Self-pay | Admitting: Internal Medicine

## 2010-07-25 ENCOUNTER — Ambulatory Visit (INDEPENDENT_AMBULATORY_CARE_PROVIDER_SITE_OTHER): Payer: BC Managed Care – PPO | Admitting: Internal Medicine

## 2010-07-25 VITALS — BP 185/91 | HR 52 | Resp 18 | Ht 71.0 in | Wt 209.0 lb

## 2010-07-25 DIAGNOSIS — I456 Pre-excitation syndrome: Secondary | ICD-10-CM

## 2010-07-25 DIAGNOSIS — I10 Essential (primary) hypertension: Secondary | ICD-10-CM

## 2010-07-25 DIAGNOSIS — I4891 Unspecified atrial fibrillation: Secondary | ICD-10-CM

## 2010-07-25 NOTE — Patient Instructions (Signed)
Your physician wants you to follow-up in: 12 months with Dr. Taylor. You will receive a reminder letter in the mail two months in advance. If you don't receive a letter, please call our office to schedule the follow-up appointment.    

## 2010-07-25 NOTE — Assessment & Plan Note (Signed)
He is currently asymptomatic. He will continue his amiodarone therapy. His ECG today demonstrates no evidence of ventricular preexcitation.

## 2010-07-25 NOTE — Progress Notes (Signed)
HPI [name] [name] returns today for followup. He is a 60 year old man with a history of WPW syndrome status post multiple catheter ablations at an outside medical facility, paroxysmal atrial fibrillation, and chronic systolic and diastolic heart failure. With control of his atrial fibrillation, his systolic function normalized. He has no palpitations. He denies chest pain or shortness of breath. Currently, his symptoms are class I. He has no peripheral edema. He continues to have occasional gastrointestinal side effects including nausea and dyspepsia. No syncope. He admits to difficulty with control his blood pressure. He denies medical noncompliance. No Known Allergies   Current Outpatient Prescriptions  Medication Sig Dispense Refill  . amiodarone (PACERONE) 200 MG tablet Take 200 mg by mouth daily. 1 tablet M-Tr. 1/2 tablet F-Sun       . carvedilol (COREG CR) 20 MG 24 hr capsule Take 40 mg by mouth.       . furosemide (LASIX) 40 MG tablet Take 40 mg by mouth 2 (two) times daily.        Marland Kitchen LORazepam (ATIVAN) 0.5 MG tablet Take 0.5 mg by mouth every 8 (eight) hours. UAD       . potassium chloride SA (K-DUR,KLOR-CON) 20 MEQ tablet Take 20 mEq by mouth daily.        . RABEprazole (ACIPHEX) 20 MG tablet Take 20 mg by mouth daily.        Marland Kitchen telmisartan (MICARDIS) 40 MG tablet Take 40 mg by mouth daily.        Marland Kitchen warfarin (COUMADIN) 6 MG tablet Take 6 mg by mouth daily. UAD          No past medical history on file.  ROS:   All systems reviewed and negative except as noted in the HPI.   Past Surgical History  Procedure Date  . Dc cardioversion      No family history on file.   History   Social History  . Marital Status: Married    Spouse Name: N/A    Number of Children: N/A  . Years of Education: N/A   Occupational History  . Not on file.   Social History Main Topics  . Smoking status: Former Games developer  . Smokeless tobacco: Not on file   Comment: stopped smoking 20 years ago  .  Alcohol Use: No  . Drug Use: Not on file  . Sexually Active: Not on file   Other Topics Concern  . Not on file   Social History Narrative  . No narrative on file     BP 185/91  Pulse 52  Resp 18  Ht 5\' 11"  (1.803 m)  Wt 209 lb (94.802 kg)  BMI 29.15 kg/m2  Physical Exam:  Well appearing middle aged man, NAD HEENT: Unremarkable Neck:  No JVD, no thyromegally Lymphatics:  No adenopathy Back:  No CVA tenderness Lungs:  Clear with no wheezes, rales or rhonchi. HEART:  Regular rate rhythm, no murmurs, no rubs, no clicks Abd:  soft, positive bowel sounds, no organomegally, no rebound, no guarding Ext:  2 plus pulses, no edema, no cyanosis, no clubbing Skin:  No rashes no nodules Neuro:  CN II through XII intact, motor grossly intact  EKG NSR with LVH, first degree AV block, LVH  Assess/Plan:

## 2010-07-25 NOTE — Assessment & Plan Note (Signed)
He has had no symptomatic atrial fibrillation. He appears to be well-controlled on amiodarone therapy.

## 2010-07-25 NOTE — Assessment & Plan Note (Signed)
His blood pressure is elevated today. I've encouraged him on the importance of a low sodium diet. He will continue his medical therapy.

## 2010-10-28 LAB — POCT CARDIAC MARKERS
CKMB, poc: <1 — ABNORMAL LOW
Myoglobin, poc: 81.7
Operator id: 294521
Troponin i, poc: <0.05

## 2010-10-28 LAB — I-STAT EC8
Glucose, Bld: 91
Hemoglobin: 15.6
Operator id: 294521
Sodium: 141
TCO2: 30
pH, Arterial: 7.359

## 2011-03-24 ENCOUNTER — Telehealth: Payer: Self-pay | Admitting: Internal Medicine

## 2011-03-24 NOTE — Telephone Encounter (Signed)
Pt rtn call [phone] or [phone]/mt

## 2011-03-24 NOTE — Telephone Encounter (Signed)
Had an episode with afib on Sun morning His HR was 115 is the ER.  The MD at the hospital wanted him to all Dr Ladona Ridgel and let him know  I spoke with patient  He feels fine  I will discuss with Dr Ladona Ridgel week

## 2011-03-24 NOTE — Telephone Encounter (Signed)
lmom for patient to return my call 

## 2011-03-24 NOTE — Telephone Encounter (Signed)
New msg Pt said he went to [facility]. He said he was out of rhythm.He wanted to talk to you about this. Please call

## 2011-04-10 ENCOUNTER — Other Ambulatory Visit: Payer: Self-pay | Admitting: Internal Medicine

## 2011-04-10 ENCOUNTER — Other Ambulatory Visit: Payer: Self-pay

## 2011-04-10 MED ORDER — FUROSEMIDE 40 MG PO TABS: 40.0000 mg | ORAL_TABLET | Freq: Two times a day (BID) | ORAL | Status: DC

## 2011-07-22 ENCOUNTER — Encounter: Payer: Self-pay | Admitting: Internal Medicine

## 2011-07-22 ENCOUNTER — Ambulatory Visit (INDEPENDENT_AMBULATORY_CARE_PROVIDER_SITE_OTHER): Payer: BC Managed Care – PPO | Admitting: Internal Medicine

## 2011-07-22 VITALS — BP 136/76 | HR 53 | Ht 71.0 in | Wt 211.4 lb

## 2011-07-22 DIAGNOSIS — I456 Pre-excitation syndrome: Secondary | ICD-10-CM

## 2011-07-22 DIAGNOSIS — I4891 Unspecified atrial fibrillation: Secondary | ICD-10-CM

## 2011-07-22 NOTE — Assessment & Plan Note (Signed)
His atrial fibrillation appears to be well-controlled. Besides avoiding stimulants and excessive caffeine, I've asked the patient to reduce his dose of amiodarone to 200 mg daily except 100 mg on Saturday and Sunday.

## 2011-07-22 NOTE — Progress Notes (Signed)
HPI [name] [name] returns today for followup. He is a very pleasant 61 year old man with PW syndrome and atrial fibrillation status post multiple ablations done in [facility]. He has been well-controlled on amiodarone therapy. He had one bout of atrial fibrillation several weeks ago after taking cough medication with a stimulant. This episode stop within a few hours. Since then he has done well. He denies chest pain or shortness of breath. No Known Allergies   Current Outpatient Prescriptions  Medication Sig Dispense Refill  . amiodarone (PACERONE) 200 MG tablet Take 200 mg by mouth daily.       . carvedilol (COREG CR) 20 MG 24 hr capsule Take 40 mg by mouth.       . furosemide (LASIX) 40 MG tablet Take 1 tablet (40 mg total) by mouth 2 (two) times daily.  30 tablet  4  . LORazepam (ATIVAN) 0.5 MG tablet Take 0.5 mg by mouth every 8 (eight) hours as needed. UAD      . potassium chloride SA (K-DUR,KLOR-CON) 20 MEQ tablet Take 20 mEq by mouth daily.        . RABEprazole (ACIPHEX) 20 MG tablet Take 20 mg by mouth daily.        Marland Kitchen warfarin (COUMADIN) 6 MG tablet Take 6 mg by mouth daily. UAD          No past medical history on file.  ROS:   All systems reviewed and negative except as noted in the HPI.   Past Surgical History  Procedure Date  . Dc cardioversion      No family history on file.   History   Social History  . Marital Status: Married    Spouse Name: N/A    Number of Children: N/A  . Years of Education: N/A   Occupational History  . Not on file.   Social History Main Topics  . Smoking status: Former Smoker -- 0.5 packs/day for 10 years    Types: Cigarettes    Quit date: 01/14/1980  . Smokeless tobacco: Not on file   Comment: stopped smoking 20 years ago  . Alcohol Use: No  . Drug Use: Not on file  . Sexually Active: Not on file   Other Topics Concern  . Not on file   Social History Narrative  . No narrative on file     BP 136/76  Pulse  53  Ht 5\' 11"  (1.803 m)  Wt 211 lb 6.4 oz (95.89 kg)  BMI 29.48 kg/m2  Physical Exam:  Well appearing middle-aged man, NAD HEENT: Unremarkable Neck:  No JVD, no thyromegally Lungs:  Clear with no wheezes, rales, or rhonchi. HEART:  Regular rate rhythm, no murmurs, no rubs, no clicks Abd:  soft, positive bowel sounds, no organomegally, no rebound, no guarding Ext:  2 plus pulses, no edema, no cyanosis, no clubbing Skin:  No rashes no nodules Neuro:  CN II through XII intact, motor grossly intact  EKG Normal sinus rhythm with first-degree block. No ventricular preexcitation is present.  Assess/Plan:

## 2011-07-22 NOTE — Assessment & Plan Note (Signed)
On amiodarone, there is no preexcitation and he has had no symptoms.

## 2011-07-22 NOTE — Patient Instructions (Addendum)
Your physician wants you to follow-up in: 6 months with Dr. Ladona Ridgel.  You will receive a reminder letter in the mail two months in advance. If you don't receive a letter, please call our office to schedule the follow-up appointment.  New Amiodarone Instructions:  1 tablet Monday - Friday and 1/2 tablet on Saturday and Sunday.

## 2012-01-20 ENCOUNTER — Encounter: Payer: Self-pay | Admitting: Internal Medicine

## 2012-01-20 ENCOUNTER — Ambulatory Visit (INDEPENDENT_AMBULATORY_CARE_PROVIDER_SITE_OTHER): Payer: BC Managed Care – PPO | Admitting: Internal Medicine

## 2012-01-20 VITALS — BP 147/75 | HR 55 | Ht 71.0 in | Wt 210.8 lb

## 2012-01-20 DIAGNOSIS — I4891 Unspecified atrial fibrillation: Secondary | ICD-10-CM

## 2012-01-20 DIAGNOSIS — I456 Pre-excitation syndrome: Secondary | ICD-10-CM

## 2012-01-20 MED ORDER — AMIODARONE HCL 200 MG PO TABS: ORAL_TABLET | ORAL | Status: DC

## 2012-01-20 NOTE — Progress Notes (Signed)
HPI [name] returns today for followup. 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 He has had very minimal amounts of atrial fibrillation. He has been bothered by lower GI bleeding. This has resolved. He notes a history of diverticular problems. He also notes a bout of food poisoning. He denies syncope, chest pain, or shortness of breath. No Known Allergies   Current Outpatient Prescriptions  Medication Sig Dispense Refill  . amiodarone (PACERONE) 200 MG tablet Take 200 mg daily as directed  45 tablet  3  . carvedilol (COREG CR) 20 MG 24 hr capsule Take 40 mg by mouth.       . furosemide (LASIX) 40 MG tablet Take 1 tablet (40 mg total) by mouth 2 (two) times daily.  30 tablet  4  . LORazepam (ATIVAN) 0.5 MG tablet Take 0.5 mg by mouth every 8 (eight) hours as needed. UAD      . potassium chloride SA (K-DUR,KLOR-CON) 20 MEQ tablet Take 20 mEq by mouth daily.        . RABEprazole (ACIPHEX) 20 MG tablet Take 20 mg by mouth daily.        Marland Kitchen warfarin (COUMADIN) 6 MG tablet Take 6 mg by mouth daily. UAD          No past medical history on file.  ROS:   All systems reviewed and negative except as noted in the HPI.   Past Surgical History  Procedure Date  . Dc cardioversion      No family history on file.   History   Social History  . Marital Status: Married    Spouse Name: N/A    Number of Children: N/A  . Years of Education: N/A   Occupational History  . Not on file.   Social History Main Topics  . Smoking status: Former Smoker -- 0.5 packs/day for 10 years    Types: Cigarettes    Quit date: 01/14/1980  . Smokeless tobacco: Not on file     Comment: stopped smoking 20 years ago  . Alcohol Use: No  . Drug Use: Not on file  . Sexually Active: Not on file   Other Topics Concern  . Not on file   Social History Narrative  . No narrative on file     BP 147/75   Pulse 55  Ht 5\' 11"  (1.803 m)  Wt 210 lb 12.8 oz (95.618 kg)  BMI 29.40 kg/m2  Physical Exam:  Well appearing middle-aged man, NAD HEENT: Unremarkable Neck:  No JVD, no thyromegally Lungs:  Clear with no wheezes, rales, or rhonchi. HEART:  Regular rate rhythm, no murmurs, no rubs, no clicks Abd:  soft, positive bowel sounds, no organomegally, no rebound, no guarding Ext:  2 plus pulses, no edema, no cyanosis, no clubbing Skin:  No rashes no nodules Neuro:  CN II through XII intact, motor grossly intact  EKG Sinus bradycardia with first degree AV block, left ventricular hypertrophy  Assess/Plan:

## 2012-01-20 NOTE — Assessment & Plan Note (Signed)
The patient is maintaining sinus rhythm very nicely. I've asked the patient to reduce his dose of amiodarone to 200 mg daily Monday through Thursday and 100 mg daily Friday through Sunday. If he notices that his heart rate was over 100, I've asked the patient to increase his amiodarone to 200 mg twice a day until the heart rate goes below 100 beats per minute.

## 2012-01-20 NOTE — Assessment & Plan Note (Signed)
He'll electrocardiogram today demonstrates no evidence of ventricular preexcitation. We'll follow.

## 2012-01-20 NOTE — Patient Instructions (Signed)
Your physician wants you to follow-up in: 6 months with Dr Court Joy will receive a reminder letter in the mail two months in advance. If you don't receive a letter, please call our office to schedule the follow-up appointment. Your physician has recommended you make the following change in your medication: CHANGE Amiodarone to 200 mg take 1 tab daily Mon - Thurs and take 1/2 tab daily Fri - Sun -- If Heart Rate is above 100 take 200 mg twice daily until Heart Rate is back below 100

## 2012-05-03 ENCOUNTER — Telehealth: Payer: Self-pay | Admitting: Internal Medicine

## 2012-05-03 NOTE — Telephone Encounter (Addendum)
Spoke with patient and and these symptoms have been going on for over 1 1/2 months.  He is under a lot of stress and has been for 2 weeks, BP is elevated and is having some CP.  He does say he has noted some dark stools in the last in the last few days and has a history of GI bleeds.  I have asked that he follow up with his PCP for labs and stool cards for that.  He did increase his Amiodarone to 200mg  bid for the last 3 days and is feeling some better.  I have let him know I would discuss with Dr Ladona Ridgel and call him back

## 2012-05-03 NOTE — Telephone Encounter (Signed)
New problem    C/O heart racing . Sob . Burning  session in chest.

## 2012-05-05 NOTE — Telephone Encounter (Signed)
New Prob     Pt went to see his PCP and was told his symptom were most likely related to stress. Test came back ok. Offered appt with Herma Carson and he declined.

## 2012-05-05 NOTE — Telephone Encounter (Signed)
LMOM TCB -- Dr Ladona Ridgel has a full schedule, we can off Jacolyn Reedy on 05/12/12 for him to come in if his symptoms are not better.

## 2012-07-22 ENCOUNTER — Ambulatory Visit: Payer: BC Managed Care – PPO | Admitting: Internal Medicine

## 2012-10-29 ENCOUNTER — Ambulatory Visit (INDEPENDENT_AMBULATORY_CARE_PROVIDER_SITE_OTHER): Payer: BC Managed Care – PPO | Admitting: Internal Medicine

## 2012-10-29 ENCOUNTER — Encounter: Payer: Self-pay | Admitting: Internal Medicine

## 2012-10-29 VITALS — BP 140/100 | HR 89 | Ht 71.0 in | Wt 208.8 lb

## 2012-10-29 DIAGNOSIS — I10 Essential (primary) hypertension: Secondary | ICD-10-CM

## 2012-10-29 DIAGNOSIS — I4891 Unspecified atrial fibrillation: Secondary | ICD-10-CM

## 2012-10-29 DIAGNOSIS — I456 Pre-excitation syndrome: Secondary | ICD-10-CM

## 2012-10-29 MED ORDER — AMIODARONE HCL 200 MG PO TABS: 200.0000 mg | ORAL_TABLET | Freq: Two times a day (BID) | ORAL | Status: DC

## 2012-10-29 NOTE — Patient Instructions (Signed)
Your physician recommends that you schedule a follow-up appointment in: 6 weeks with Dr Ladona Ridgel     Your physician has recommended you make the following change in your medication:  1) Increase Amiodarone to 200mg  twice daily

## 2012-10-29 NOTE — Assessment & Plan Note (Signed)
The patient has recurrent atrial fibrillation. Interestingly, his ventricular rate is now controlled. I've asked the patient to increase his dose of amiodarone from 200 mg alternating with 100 mg daily up to 400 mg daily. I plan to see him back in approximately 5 or 6 weeks. If he has not reverted back to sinus rhythm, we will schedule cardioversion. He will followup to have his Coumadin level checked next week. I have counseled him that his Coumadin requirements may be reduced on the higher dose of amiodarone.

## 2012-10-29 NOTE — Assessment & Plan Note (Signed)
His blood pressure remains elevated. He is encouraged to reduce his salt intake. If he is back in rhythm I see him again, we will consider up titration of his medical therapy.

## 2012-10-29 NOTE — Assessment & Plan Note (Signed)
The patient has no evidence of accessory pathway conduction on his ECG today. We will undergo watchful waiting.

## 2012-10-29 NOTE — Progress Notes (Signed)
HPI [name] returns today for followup. 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 He has had very minimal amounts of atrial fibrillation until 2 weeks ago. Since then, he has been in and out of atrial fibrillation at least by his symptoms. When I told the patient that he was out of rhythm today, he seemed somewhat surprised.  He denies syncope, chest pain, but has some increased shortness of breath, particularly with exertion.. No Known Allergies   Current Outpatient Prescriptions  Medication Sig Dispense Refill  . amiodarone (PACERONE) 200 MG tablet Take 200 mg daily as directed  45 tablet  3  . carvedilol (COREG CR) 20 MG 24 hr capsule Take 40 mg by mouth.       . DEXILANT 60 MG capsule Take 1 capsule by mouth daily.      . furosemide (LASIX) 40 MG tablet Take 40 mg by mouth 2 (two) times daily as needed.      . potassium chloride SA (K-DUR,KLOR-CON) 20 MEQ tablet Take 20 mEq by mouth daily.        Marland Kitchen warfarin (COUMADIN) 6 MG tablet Take 6 mg by mouth daily. UAD        No current facility-administered medications for this visit.     History reviewed. No pertinent past medical history.  ROS:   All systems reviewed and negative except as noted in the HPI.   Past Surgical History  Procedure Laterality Date  . Dc cardioversion       History reviewed. No pertinent family history.   History   Social History  . Marital Status: Married    Spouse Name: N/A    Number of Children: N/A  . Years of Education: N/A   Occupational History  . Not on file.   Social History Main Topics  . Smoking status: Former Smoker -- 0.50 packs/day for 10 years    Types: Cigarettes    Quit date: 01/14/1980  . Smokeless tobacco: Not on file     Comment: stopped smoking 20 years ago  . Alcohol Use: No  . Drug Use: Not on file  . Sexual Activity: Not on file   Other Topics Concern  . Not  on file   Social History Narrative  . No narrative on file     BP 140/100  Pulse 89  Ht 5\' 11"  (1.803 m)  Wt 208 lb 12.8 oz (94.711 kg)  BMI 29.13 kg/m2  Physical Exam:  Well appearing middle-aged man, NAD HEENT: Unremarkable Neck:  No JVD, no thyromegally Lungs:  Clear with no wheezes, rales, or rhonchi. HEART:  IRegular rate rhythm, no murmurs, no rubs, no clicks Abd:  soft, positive bowel sounds, no organomegally, no rebound, no guarding Ext:  2 plus pulses, no edema, no cyanosis, no clubbing Skin:  No rashes no nodules Neuro:  CN II through XII intact, motor grossly intact  EKG Atrial fibrillation with a controlled ventricular response, left ventricular hypertrophy, and left axis deviation.  Assess/Plan:

## 2012-12-13 ENCOUNTER — Ambulatory Visit (INDEPENDENT_AMBULATORY_CARE_PROVIDER_SITE_OTHER): Payer: BC Managed Care – PPO | Admitting: Internal Medicine

## 2012-12-13 ENCOUNTER — Encounter: Payer: Self-pay | Admitting: Internal Medicine

## 2012-12-13 VITALS — BP 142/88 | HR 49 | Ht 71.0 in | Wt 216.0 lb

## 2012-12-13 DIAGNOSIS — I10 Essential (primary) hypertension: Secondary | ICD-10-CM

## 2012-12-13 DIAGNOSIS — I456 Pre-excitation syndrome: Secondary | ICD-10-CM

## 2012-12-13 DIAGNOSIS — I4891 Unspecified atrial fibrillation: Secondary | ICD-10-CM

## 2012-12-13 MED ORDER — AMIODARONE HCL 200 MG PO TABS: 200.0000 mg | ORAL_TABLET | Freq: Every day | ORAL | Status: DC

## 2012-12-13 NOTE — Assessment & Plan Note (Signed)
His blood pressure is fairly well controlled. Will follow. 

## 2012-12-13 NOTE — Addendum Note (Signed)
Addended by: Baird Lyons on: 12/13/2012 10:42 AM   Modules accepted: Orders

## 2012-12-13 NOTE — Progress Notes (Signed)
      HPI [name] [name] returns today for followup. 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 When I saw the patient last several weeks ago, he had reverted to atrial fibrillation on low-dose amiodarone therapy. We increased his dose up to 400 mg daily and he subsequently reverted back to sinus rhythm spontaneously. He self reduced his dose of amiodarone down to 200 mg daily. He denies chest pain or shortness of breath. No syncope. No peripheral edema. No Known Allergies   Current Outpatient Prescriptions  Medication Sig Dispense Refill  . amiodarone (PACERONE) 200 MG tablet Take 1 tablet (200 mg total) by mouth 2 (two) times daily. Take 200 mg daily as directed  60 tablet  11  . carvedilol (COREG CR) 20 MG 24 hr capsule Take 40 mg by mouth.       . DEXILANT 60 MG capsule Take 1 capsule by mouth daily.      . furosemide (LASIX) 40 MG tablet Take 40 mg by mouth 2 (two) times daily as needed.      . potassium chloride SA (K-DUR,KLOR-CON) 20 MEQ tablet Take 20 mEq by mouth daily.        . RABEprazole (ACIPHEX) 20 MG tablet 20 mg daily.      Marland Kitchen warfarin (COUMADIN) 6 MG tablet Take 6 mg by mouth daily. UAD        No current facility-administered medications for this visit.     History reviewed. No pertinent past medical history.  ROS:   All systems reviewed and negative except as noted in the HPI.   Past Surgical History  Procedure Laterality Date  . Dc cardioversion       History reviewed. No pertinent family history.   History   Social History  . Marital Status: Married    Spouse Name: N/A    Number of Children: N/A  . Years of Education: N/A   Occupational History  . Not on file.   Social History Main Topics  . Smoking status: Former Smoker -- 0.50 packs/day for 10 years    Types: Cigarettes    Quit date: 01/14/1980    . Smokeless tobacco: Not on file     Comment: stopped smoking 20 years ago  . Alcohol Use: No  . Drug Use: Not on file  . Sexual Activity: Not on file   Other Topics Concern  . Not on file   Social History Narrative  . No narrative on file     BP 142/88  Pulse 49  Ht 5\' 11"  (1.803 m)  Wt 216 lb (97.977 kg)  BMI 30.14 kg/m2  Physical Exam:  Well appearing middle-aged man, NAD HEENT: Unremarkable Neck:  No JVD, no thyromegally Back:  No CVA tenderness Lungs:  Clear with no wheezes, rales, or rhonchi. No increased work of breathing HEART:  Regular brady rhythm, no murmurs, no rubs, no clicks Abd:  soft, positive bowel sounds, no organomegally, no rebound, no guarding Ext:  2 plus pulses, no edema, no cyanosis, no clubbing Skin:  No rashes no nodules Neuro:  CN II through XII intact, motor grossly intact  EKG - sinus bradycardia  Assess/Plan:

## 2012-12-13 NOTE — Patient Instructions (Signed)
Your physician has recommended you make the following change in your medication:  1) Decrease your Amiodarone on January 1 to 200 mg daily  Your physician recommends that you schedule a follow-up appointment in: early March with Dr. Ladona Ridgel.

## 2012-12-13 NOTE — Assessment & Plan Note (Signed)
He has reverted back to sinus rhythm. He will decrease his dose of amiodarone down to 300 mg daily for the next month, and then further reduce his dose down to 200 mg daily. I will see him back in 3 months.

## 2013-03-08 ENCOUNTER — Encounter: Payer: Self-pay | Admitting: Internal Medicine

## 2013-03-08 NOTE — Progress Notes (Signed)
Patient ID: [name] [name], male   DOB: 08/28/1950, 63 y.o.   MRN: [id number] Called today for [facility] ER. [name] [name] has been seen in the ER there with chest pain and ruled out for MI. ER MD wanted a stress test done. He has an abnormal ECG with right bundle branch block and non-specific STT changes from LVH and chronic amiodarone therapy. I have recommended her undergo exercise myoview stress testing. This will be scheduled as soon as possible.  Leonia Reeves.D.

## 2013-03-09 ENCOUNTER — Ambulatory Visit (HOSPITAL_COMMUNITY): Payer: BC Managed Care – PPO | Attending: Internal Medicine | Admitting: Radiology

## 2013-03-09 ENCOUNTER — Encounter (HOSPITAL_COMMUNITY): Payer: BC Managed Care – PPO

## 2013-03-09 VITALS — BP 186/95 | HR 53 | Ht 71.0 in | Wt 212.0 lb

## 2013-03-09 DIAGNOSIS — R42 Dizziness and giddiness: Secondary | ICD-10-CM | POA: Insufficient documentation

## 2013-03-09 DIAGNOSIS — R0602 Shortness of breath: Secondary | ICD-10-CM | POA: Insufficient documentation

## 2013-03-09 DIAGNOSIS — I4891 Unspecified atrial fibrillation: Secondary | ICD-10-CM | POA: Insufficient documentation

## 2013-03-09 DIAGNOSIS — Z87891 Personal history of nicotine dependence: Secondary | ICD-10-CM | POA: Insufficient documentation

## 2013-03-09 DIAGNOSIS — I10 Essential (primary) hypertension: Secondary | ICD-10-CM | POA: Insufficient documentation

## 2013-03-09 DIAGNOSIS — R55 Syncope and collapse: Secondary | ICD-10-CM

## 2013-03-09 DIAGNOSIS — R079 Chest pain, unspecified: Secondary | ICD-10-CM

## 2013-03-09 DIAGNOSIS — R002 Palpitations: Secondary | ICD-10-CM | POA: Insufficient documentation

## 2013-03-09 MED ORDER — TECHNETIUM TC 99M SESTAMIBI GENERIC - CARDIOLITE
11.0000 | Freq: Once | INTRAVENOUS | Status: AC | PRN
  Administered 2013-03-09: 11 via INTRAVENOUS

## 2013-03-09 MED ORDER — TECHNETIUM TC 99M SESTAMIBI GENERIC - CARDIOLITE
33.0000 | Freq: Once | INTRAVENOUS | Status: AC | PRN
  Administered 2013-03-09: 33 via INTRAVENOUS

## 2013-03-09 MED ORDER — REGADENOSON 0.4 MG/5ML IV SOLN
0.4000 mg | Freq: Once | INTRAVENOUS | Status: AC
  Administered 2013-03-09: 0.4 mg via INTRAVENOUS

## 2013-03-09 NOTE — Progress Notes (Signed)
[facility] SITE 3 NUCLEAR MED [address] [place], [place] [address] [phone]    Cardiology Nuclear Med Study  [name] [name] is a 63 y.o. male     MRN : [id number]     DOB: 21-Sep-1950  Procedure Date: 03/09/2013  Nuclear Med Background Indication for Stress Test:  Evaluation for Ischemia and Post Hospital 2/15 CP, syncope History:  Afib (PAF), MPI ~ 55yrs ago (normal per pt.) Cardiac Risk Factors: History of Smoking and Hypertension  Symptoms:  Chest Pain (last date of chest discomfort was last Sunday), Dizziness, Light-Headedness, Palpitations, SOB and Syncope   Nuclear Pre-Procedure Caffeine/Decaff Intake:  None NPO After: 4:00am   Lungs:  clear O2 Sat: 97% on room air. IV 0.9% NS with Angio Cath:  22g  IV Site: R Hand  IV Started by:  Bonnita Levan, RN  Chest Size (in):  42 Cup Size: n/a  Height: 5\' 11"  (1.803 m)  Weight:  212 lb (96.163 kg)  BMI:  Body mass index is 29.58 kg/(m^2). Tech Comments:  Coreg held x 24 hrs    Nuclear Med Study 1 or 2 day study: 1 day  Stress Test Type:  Stress  Reading MD: N/A  Order Authorizing Provider:  Lewayne Bunting, MD  Resting Radionuclide: Technetium 47m Sestamibi  Resting Radionuclide Dose: 11.0 mCi   Stress Radionuclide:  Technetium 33m Sestamibi  Stress Radionuclide Dose: 33.0 mCi           Stress Protocol Rest HR: 53 Stress HR: 100  Rest BP: 186/95 Stress BP: 206/62  Exercise Time (min): n/a METS: n/a           Dose of Adenosine (mg):  n/a Dose of Lexiscan: 0.4 mg  Dose of Atropine (mg): n/a Dose of Dobutamine: n/a mcg/kg/min (at max HR)  Stress Test Technologist: Nelson Chimes, BS-ES  Nuclear Technologist:  Domenic Polite, CNMT     Rest Procedure:  Myocardial perfusion imaging was performed at rest 45 minutes following the intravenous administration of Technetium 33m Sestamibi. Rest ECG: NSR - Normal EKG  Stress Procedure:  The patient received IV Lexiscan 0.4 mg over 15-seconds with concurrent low  level exercise and then Technetium 46m Sestamibi was injected at 30-seconds while the patient continued walking one more minute.  Quantitative spect images were obtained after a 45-minute delay.  Attempted to walk patient on Bruce Protocol.  Due to hip pain/burning had to switch to Low Level Lexiscan.  During the four minutes the patient walked the Bruce Protocol, the patient experienced a 6/10 chest burning.  This began to resolve in recovery.  Stress ECG: No significant change from baseline ECG  QPS Raw Data Images:  Normal; no motion artifact; normal heart/lung ratio. Stress Images:  Normal homogeneous uptake in all areas of the myocardium. Rest Images:  Normal homogeneous uptake in all areas of the myocardium. Subtraction (SDS):  No evidence of ischemia. Transient Ischemic Dilatation (Normal <1.22):  1.02 Lung/Heart Ratio (Normal <0.45):  0.35  Quantitative Gated Spect Images QGS EDV:  147 ml QGS ESV:  56 ml  Impression Exercise Capacity:  Lexiscan with low level exercise. BP Response:  Hypertensive blood pressure response. Clinical Symptoms:  Mild chest pain/dyspnea. ECG Impression:  No significant ST segment change suggestive of ischemia. Comparison with Prior Nuclear Study: No images to compare  Overall Impression:  Normal stress nuclear study.  LV Ejection Fraction: 62%.  LV Wall Motion:  NL LV Function; NL Wall Motion.    [name],  [name], MD, Iu Health Jay HospitalFACC 03/09/2013, 3:17 PM Office - [phone] Pager [phone]

## 2013-03-10 ENCOUNTER — Other Ambulatory Visit (HOSPITAL_COMMUNITY): Payer: Self-pay | Admitting: Radiology

## 2013-03-10 DIAGNOSIS — R079 Chest pain, unspecified: Secondary | ICD-10-CM

## 2013-03-10 NOTE — Addendum Note (Signed)
Addended by: Milinda Antis on: 03/10/2013 11:54 AM   Modules accepted: Orders

## 2013-03-12 ENCOUNTER — Other Ambulatory Visit: Payer: Self-pay | Admitting: Internal Medicine

## 2013-03-22 ENCOUNTER — Encounter (INDEPENDENT_AMBULATORY_CARE_PROVIDER_SITE_OTHER): Payer: Self-pay

## 2013-03-22 ENCOUNTER — Ambulatory Visit (INDEPENDENT_AMBULATORY_CARE_PROVIDER_SITE_OTHER): Payer: BC Managed Care – PPO | Admitting: Internal Medicine

## 2013-03-22 ENCOUNTER — Encounter: Payer: Self-pay | Admitting: Internal Medicine

## 2013-03-22 VITALS — BP 142/86 | HR 71 | Ht 71.0 in | Wt 209.0 lb

## 2013-03-22 DIAGNOSIS — I4892 Unspecified atrial flutter: Secondary | ICD-10-CM

## 2013-03-22 DIAGNOSIS — I4891 Unspecified atrial fibrillation: Secondary | ICD-10-CM

## 2013-03-22 DIAGNOSIS — I10 Essential (primary) hypertension: Secondary | ICD-10-CM

## 2013-03-22 MED ORDER — AMIODARONE HCL 200 MG PO TABS: 400.0000 mg | ORAL_TABLET | Freq: Every day | ORAL | Status: DC

## 2013-03-22 NOTE — Patient Instructions (Signed)
Your physician recommends that you schedule a follow-up appointment in: 3 weeks in nurse room for EKG  If still out of rhythm will need to be set up for a DCCV with Dr Ladona Ridgel  Your physician has recommended you make the following change in your medication:  1) Increase Amiodarone to 2 daily

## 2013-03-23 ENCOUNTER — Encounter: Payer: Self-pay | Admitting: Internal Medicine

## 2013-03-23 NOTE — Assessment & Plan Note (Signed)
I have asked the patient to increase his amiodarone to 400 mg daily. Will plan DCCV in a couple of weeks. Hopefully he will revert to NSR on spontaneously.

## 2013-03-23 NOTE — Progress Notes (Signed)
      HPI [name] [name] returns today for followup. 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 When I saw the patient last several months ago, he had reverted to atrial fibrillation on low-dose amiodarone therapy. We increased his dose up to 400 mg daily and he subsequently reverted back to sinus rhythm spontaneously. He self reduced his dose of amiodarone down to 200 mg daily and he has reverted back to NSR. He has been to the ER with chest pain and syncope. He was in atrial fib at that time.  He denies shortness of breath. No syncope. No peripheral edema. Allergies  Allergen Reactions  . Metoprolol Other (See Comments)    Fluid retention     Current Outpatient Prescriptions  Medication Sig Dispense Refill  . amiodarone (PACERONE) 200 MG tablet Take 2 tablets (400 mg total) by mouth daily.  60 tablet  11  . carvedilol (COREG CR) 20 MG 24 hr capsule Take 40 mg by mouth daily.       . furosemide (LASIX) 40 MG tablet Take 40 mg by mouth 2 (two) times daily as needed.      . potassium chloride SA (K-DUR,KLOR-CON) 20 MEQ tablet Take 20 mEq by mouth daily.        . RABEprazole (ACIPHEX) 20 MG tablet Take 20 mg by mouth daily.       Marland Kitchen warfarin (COUMADIN) 6 MG tablet Take 6 mg by mouth daily. ROTATING WITH 9 MG ONCE A DAY       No current facility-administered medications for this visit.     History reviewed. No pertinent past medical history.  ROS:   All systems reviewed and negative except as noted in the HPI.   Past Surgical History  Procedure Laterality Date  . Dc cardioversion       History reviewed. No pertinent family history.   History   Social History  . Marital Status: Married    Spouse Name: N/A    Number of Children: N/A  . Years of Education: N/A   Occupational History  . Not on file.   Social History Main  Topics  . Smoking status: Former Smoker -- 0.50 packs/day for 10 years    Types: Cigarettes    Quit date: 01/14/1980  . Smokeless tobacco: Not on file     Comment: stopped smoking 20 years ago  . Alcohol Use: No  . Drug Use: Not on file  . Sexual Activity: Not on file   Other Topics Concern  . Not on file   Social History Narrative  . No narrative on file     BP 142/86  Pulse 71  Ht 5\' 11"  (1.803 m)  Wt 209 lb (94.802 kg)  BMI 29.16 kg/m2  Physical Exam:  Well appearing middle-aged man, NAD HEENT: Unremarkable Neck:  No JVD, no thyromegally Back:  No CVA tenderness Lungs:  Clear with no wheezes, rales, or rhonchi. No increased work of breathing HEART:  Regular brady rhythm, no murmurs, no rubs, no clicks Abd:  soft, positive bowel sounds, no organomegally, no rebound, no guarding Ext:  2 plus pulses, no edema, no cyanosis, no clubbing Skin:  No rashes no nodules Neuro:  CN II through XII intact, motor grossly intact  EKG - atrial fib with a controlled VR  Assess/Plan:

## 2013-03-23 NOTE — Assessment & Plan Note (Signed)
His blood pressure is elevated but he notes that it has been better controlled at home. I have asked him to follow his pressure. He is encouraged to maintain a low sodium diet.

## 2013-04-12 ENCOUNTER — Ambulatory Visit (INDEPENDENT_AMBULATORY_CARE_PROVIDER_SITE_OTHER): Payer: BC Managed Care – PPO | Admitting: *Deleted

## 2013-04-12 VITALS — BP 136/78 | HR 50 | Resp 16

## 2013-04-12 DIAGNOSIS — I4891 Unspecified atrial fibrillation: Secondary | ICD-10-CM

## 2013-04-12 DIAGNOSIS — I4892 Unspecified atrial flutter: Secondary | ICD-10-CM

## 2013-04-12 MED ORDER — AMIODARONE HCL 200 MG PO TABS: ORAL_TABLET | ORAL | Status: DC

## 2013-04-12 NOTE — Progress Notes (Signed)
Pt ambulated without assistance to nurse triage room, no signs of distress. Ekg performed, SR @50  bpm. Dr Ladona Ridgel reviewed, orders followed. Amiodarone 400 mg daily x 1 month then reduce to 200 mg daily. F/u in 3 months. Pt was updated and verbalized understanding.  App given/ pharmacy was updated.

## 2013-07-06 ENCOUNTER — Ambulatory Visit (INDEPENDENT_AMBULATORY_CARE_PROVIDER_SITE_OTHER): Payer: BC Managed Care – PPO | Admitting: Internal Medicine

## 2013-07-06 ENCOUNTER — Encounter: Payer: Self-pay | Admitting: Internal Medicine

## 2013-07-06 VITALS — BP 164/79 | HR 51 | Ht 72.0 in | Wt 215.0 lb

## 2013-07-06 DIAGNOSIS — I10 Essential (primary) hypertension: Secondary | ICD-10-CM

## 2013-07-06 DIAGNOSIS — I4891 Unspecified atrial fibrillation: Secondary | ICD-10-CM

## 2013-07-06 DIAGNOSIS — I480 Paroxysmal atrial fibrillation: Secondary | ICD-10-CM

## 2013-07-06 NOTE — Patient Instructions (Signed)
Your physician wants you to follow-up in: 6 MONTHS. You will receive a reminder letter in the mail two months in advance. If you don't receive a letter, please call our office to schedule the follow-up appointment.   NO CHANGES WERE MADE TODAY

## 2013-07-06 NOTE — Progress Notes (Signed)
      HPI [name] [name] returns today for followup. 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 He has had intermittent episodes of atrial fibrillation, particularly when he reduces his dose of amiodarone. He has also had some GI reflux, which is improved with Maalox.  Allergies  Allergen Reactions  . Metoprolol Other (See Comments)    Fluid retention     Current Outpatient Prescriptions  Medication Sig Dispense Refill  . amiodarone (PACERONE) 200 MG tablet Take 400 mg daily for one month (05/12/13) then reduce to and continue at 200 mg daily  60 tablet  11  . carvedilol (COREG CR) 20 MG 24 hr capsule Take 40 mg by mouth daily.       . furosemide (LASIX) 40 MG tablet Take 40 mg by mouth 2 (two) times daily as needed.      . potassium chloride SA (K-DUR,KLOR-CON) 20 MEQ tablet Take 20 mEq by mouth daily.        . RABEprazole (ACIPHEX) 20 MG tablet Take 20 mg by mouth daily.       Marland Kitchen warfarin (COUMADIN) 6 MG tablet Take 6 mg by mouth daily. ROTATING WITH 9 MG ONCE A DAY       No current facility-administered medications for this visit.     History reviewed. No pertinent past medical history.  ROS:   All systems reviewed and negative except as noted in the HPI.   Past Surgical History  Procedure Laterality Date  . Dc cardioversion       History reviewed. No pertinent family history.   History   Social History  . Marital Status: Married    Spouse Name: N/A    Number of Children: N/A  . Years of Education: N/A   Occupational History  . Not on file.   Social History Main Topics  . Smoking status: Former Smoker -- 0.50 packs/day for 10 years    Types: Cigarettes    Quit date: 01/14/1980  . Smokeless tobacco: Not on file     Comment: stopped smoking 20 years ago  . Alcohol Use: No  . Drug Use: Not on file  . Sexual  Activity: Not on file   Other Topics Concern  . Not on file   Social History Narrative  . No narrative on file     BP 164/79  Pulse 51  Ht 6' (1.829 m)  Wt 215 lb (97.523 kg)  BMI 29.15 kg/m2  Physical Exam:  Well appearing middle-aged man, NAD HEENT: Unremarkable Neck:  No JVD, no thyromegally Back:  No CVA tenderness Lungs:  Clear with no wheezes, rales, or rhonchi. No increased work of breathing HEART:  Regular brady rhythm, no murmurs, no rubs, no clicks Abd:  soft, positive bowel sounds, no organomegally, no rebound, no guarding Ext:  2 plus pulses, no edema, no cyanosis, no clubbing Skin:  No rashes no nodules Neuro:  CN II through XII intact, motor grossly intact  EKG - atrial fib with a controlled VR  Assess/Plan:

## 2013-07-06 NOTE — Assessment & Plan Note (Signed)
His blood pressure is elevated today and we discussed changing his meds. I have reviewed his old med list dating back several years. He had been on 2 different ARB's in the past. Will hold off on any new meds.

## 2013-07-06 NOTE — Assessment & Plan Note (Signed)
He will continue his current dose of amiodarone. I considered increasing the dose today but have decided to wait until his atrial fib recurs. No other changes.

## 2013-12-27 ENCOUNTER — Ambulatory Visit (INDEPENDENT_AMBULATORY_CARE_PROVIDER_SITE_OTHER): Payer: BC Managed Care – PPO | Admitting: Internal Medicine

## 2013-12-27 ENCOUNTER — Encounter: Payer: Self-pay | Admitting: Internal Medicine

## 2013-12-27 VITALS — BP 130/82 | HR 50 | Ht 71.0 in | Wt 215.0 lb

## 2013-12-27 DIAGNOSIS — I456 Pre-excitation syndrome: Secondary | ICD-10-CM

## 2013-12-27 DIAGNOSIS — I10 Essential (primary) hypertension: Secondary | ICD-10-CM

## 2013-12-27 DIAGNOSIS — I480 Paroxysmal atrial fibrillation: Secondary | ICD-10-CM

## 2013-12-27 NOTE — Patient Instructions (Signed)
Your physician wants you to follow-up in: 12 months with Dr. Taylor. You will receive a reminder letter in the mail two months in advance. If you don't receive a letter, please call our office to schedule the follow-up appointment.    

## 2013-12-27 NOTE — Assessment & Plan Note (Signed)
His sinus node is slow today, and he has some evidence of sinus node dysfunction, but no indication for permanent pacemaker at this time as he is not particularly symptomatic.

## 2013-12-27 NOTE — Assessment & Plan Note (Signed)
His blood pressure was elevated several weeks ago, and amlodipine was begun, 5 mg daily. This is in addition to his carvedilol, and diuretic.

## 2013-12-27 NOTE — Assessment & Plan Note (Signed)
He is maintaining sinus rhythm very nicely. He will continue amiodarone 200 mg daily.

## 2013-12-27 NOTE — Progress Notes (Signed)
HPI [name] returns today for followup. 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 He has had intermittent episodes of atrial fibrillation, particularly when he reduces his dose of amiodarone. He has also had some GI reflux, which is improved with Maalox. He had one visit to the emergency room with chest pain, but no obvious ischemic event. He has had no symptomatic atrial fibrillation since his last visit. He continues on 200 mg of amiodarone daily. Allergies  Allergen Reactions  . Cardizem  [Diltiazem]     Other reaction(s): Unknown  . Digoxin And Related Other (See Comments)    unknown  . Metoprolol Other (See Comments)    Fluid retention  . Pantoprazole Sodium Other (See Comments)    Stomach pain  . Propafenone Hcl Er Other (See Comments)    H/A, N/V, stomach upset     Current Outpatient Prescriptions  Medication Sig Dispense Refill  . amiodarone (PACERONE) 200 MG tablet Take 400 mg daily for one month (05/12/13) then reduce to and continue at 200 mg daily (Patient taking differently: Take 200 mg by mouth daily) 60 tablet 11  . amLODipine (NORVASC) 5 MG tablet Take 5 mg by mouth daily.    . carvedilol (COREG CR) 20 MG 24 hr capsule Take 40 mg by mouth daily.     . furosemide (LASIX) 40 MG tablet Take 40 mg by mouth 2 (two) times daily as needed (swelling).     . potassium chloride SA (K-DUR,KLOR-CON) 20 MEQ tablet Take 20 mEq by mouth daily.      . RABEprazole (ACIPHEX) 20 MG tablet Take 20 mg by mouth daily.     Marland Kitchen warfarin (COUMADIN) 6 MG tablet Take 6 mg by mouth daily. ROTATING WITH 9 MG ONCE A DAY     No current facility-administered medications for this visit.     No past medical history on file.  ROS:   All systems reviewed and negative except as noted in the HPI.   Past Surgical History  Procedure  Laterality Date  . Dc cardioversion       No family history on file.   History   Social History  . Marital Status: Married    Spouse Name: N/A    Number of Children: N/A  . Years of Education: N/A   Occupational History  . Not on file.   Social History Main Topics  . Smoking status: Former Smoker -- 0.50 packs/day for 10 years    Types: Cigarettes    Quit date: 01/14/1980  . Smokeless tobacco: Not on file     Comment: stopped smoking 20 years ago  . Alcohol Use: No  . Drug Use: Not on file  . Sexual Activity: Not on file   Other Topics Concern  . Not on file   Social History Narrative     BP 130/82 mmHg  Pulse 50  Ht 5\' 11"  (1.803 m)  Wt 215 lb (97.523 kg)  BMI 30.00 kg/m2  Physical Exam:  Well appearing middle-aged man, NAD HEENT: Unremarkable Neck:  No JVD, no thyromegally Back:  No CVA tenderness Lungs:  Clear with no wheezes, rales, or rhonchi. No increased work of breathing HEART:  Regular brady rhythm, no murmurs, no rubs, no clicks Abd:  soft, positive bowel sounds, no organomegally, no rebound, no guarding Ext:  2 plus pulses, no edema, no  cyanosis, no clubbing Skin:  No rashes no nodules Neuro:  CN II through XII intact, motor grossly intact  EKG - normal sinus rhythm with first-degree AV block, the rate is 50 bpm. There is left ventricular hypertrophy.  Assess/Plan:

## 2013-12-27 NOTE — Assessment & Plan Note (Signed)
His ECG today demonstrates no ventricular preexcitation and a longer PR interval. He will continue his amiodarone therapy.

## 2014-11-13 ENCOUNTER — Telehealth: Payer: Self-pay | Admitting: Internal Medicine

## 2014-11-13 NOTE — Telephone Encounter (Signed)
New Message     Pt calling stating that he went to the hospital and they now want him to start taking Furosemide 40 mg, Rabeprazole 20 mg, Atordastatin 20 mg, Lisinopril 10 mg, Asprin 80 mg. Pt wants to know what Dr. Ladona Ridgel thinks about all of this before he takes any of it. Please call back and advise.

## 2014-11-15 NOTE — Telephone Encounter (Signed)
Follow up   Pt wants to tell rn what hospital he was at:  [facility]  [facility]

## 2014-11-15 NOTE — Telephone Encounter (Signed)
Left message for patient that without seeing his hospital records can not advise on medications and to call me back

## 2014-11-15 NOTE — Telephone Encounter (Signed)
F/u ° ° °Pt returning your call °

## 2014-11-15 NOTE — Telephone Encounter (Addendum)
11/10/14 and stayed over night at [facility] in for CP and palpitations.  He called his PCP and they advised him to go to the ER.  BP was high 184/104 HR was ws really varying.  He was in afib.  He says he is he wants Dr Bruna Potter opinion in regards to the medications advised.  I have let him know I will request for his records and call in once we review.  He is due to see Dr Ladona Ridgel in December as it is

## 2014-11-21 ENCOUNTER — Telehealth: Payer: Self-pay | Admitting: Internal Medicine

## 2014-11-21 NOTE — Telephone Encounter (Signed)
New message     FYI Pt is scheduled to see Dr Ladona Ridgel in dec.  He was recently in the hosp.  They are faxing their 11-20-14 ov note and the [facility] note for Dr Ladona Ridgel to review and see if he want to see pt sooner.

## 2014-11-21 NOTE — Telephone Encounter (Signed)
Patient is seeing Dr Ladona Ridgel 12/06/14

## 2014-11-21 NOTE — Telephone Encounter (Signed)
See previous note  Patient has an appointment 12/06/14

## 2014-12-06 ENCOUNTER — Encounter: Payer: Self-pay | Admitting: Internal Medicine

## 2014-12-06 ENCOUNTER — Ambulatory Visit (INDEPENDENT_AMBULATORY_CARE_PROVIDER_SITE_OTHER): Payer: Medicare Other | Admitting: Internal Medicine

## 2014-12-06 ENCOUNTER — Ambulatory Visit (INDEPENDENT_AMBULATORY_CARE_PROVIDER_SITE_OTHER): Payer: Medicare Other

## 2014-12-06 VITALS — BP 142/80 | HR 80 | Ht 71.0 in | Wt 219.2 lb

## 2014-12-06 DIAGNOSIS — R002 Palpitations: Secondary | ICD-10-CM

## 2014-12-06 DIAGNOSIS — I10 Essential (primary) hypertension: Secondary | ICD-10-CM

## 2014-12-06 DIAGNOSIS — I480 Paroxysmal atrial fibrillation: Secondary | ICD-10-CM

## 2014-12-06 DIAGNOSIS — I456 Pre-excitation syndrome: Secondary | ICD-10-CM

## 2014-12-06 DIAGNOSIS — R42 Dizziness and giddiness: Secondary | ICD-10-CM

## 2014-12-06 NOTE — Progress Notes (Signed)
HPI [name] [name] returns today for followup. 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 He has had intermittent episodes of atrial fibrillation, particularly when he reduces his dose of amiodarone. He has also had some GI reflux, which is improved with Maalox. He had one visit to the emergency room with chest pain, but no obvious ischemic event. He has had palpitations and dizzy spells but no syncope. He was noted to have pauses at an outside hospital. He was told to hold his beta blocker.  Allergies  Allergen Reactions  . Aspirin Other (See Comments)    Other reaction(s): Unknown  . Cardizem  [Diltiazem]     Other reaction(s): Unknown  . Digoxin And Related Other (See Comments)    unknown  . Diltiazem Hcl     Other reaction(s): Generalized Edema (intolerance) Other reaction(s): Unknown  . Metoprolol Other (See Comments)    Fluid retention  . Ouabain     Other reaction(s): Generalized Edema (intolerance) unknown  . Pantoprazole Sodium Other (See Comments)    Stomach pain  . Propafenone Hcl Er Other (See Comments)    H/A, N/V, stomach upset     Current Outpatient Prescriptions  Medication Sig Dispense Refill  . acetaminophen-codeine (TYLENOL #3) 300-30 MG tablet Take 1 tablet by mouth every 6 (six) hours as needed. Pain    . amiodarone (PACERONE) 200 MG tablet Take 200 mg by mouth daily except take 1/2 tablet on Friday, Saturday and Sunday    . amoxicillin (AMOXIL) 500 MG capsule Take 1 capsule by mouth 4 (four) times daily.    . carvedilol (COREG CR) 20 MG 24 hr capsule Take 40 mg by mouth daily.     . furosemide (LASIX) 40 MG tablet Take 40 mg by mouth as directed. Only take on Monday, Wednesday and Friday    . JANTOVEN 6 MG tablet Take 1 tablet by mouth as directed. Five days a week    . potassium chloride  (K-DUR,KLOR-CON) 10 MEQ tablet Take 10 mEq by mouth daily.    . RABEprazole (ACIPHEX) 20 MG tablet Take 20 mg by mouth daily.     Marland Kitchen warfarin (JANTOVEN) 3 MG tablet Take 3 mg by mouth as directed. On weekends     No current facility-administered medications for this visit.     No past medical history on file.  ROS:   All systems reviewed and negative except as noted in the HPI.   Past Surgical History  Procedure Laterality Date  . Dc cardioversion       No family history on file.   Social History   Social History  . Marital Status: Married    Spouse Name: N/A  . Number of Children: N/A  . Years of Education: N/A   Occupational History  . Not on file.   Social History Main Topics  . Smoking status: Former Smoker -- 0.50 packs/day for 10 years    Types: Cigarettes    Quit date: 01/14/1980  . Smokeless tobacco: Not on file     Comment: stopped smoking 20 years ago  . Alcohol Use: No  . Drug Use: Not on file  . Sexual Activity: Not on file   Other Topics Concern  . Not on file   Social History Narrative     BP 142/80 mmHg  Pulse 80  Ht 5\' 11"  (1.803 m)  Wt 219 lb 3.2 oz (99.428 kg)  BMI 30.59 kg/m2  Physical Exam:  Well appearing middle-aged man, NAD HEENT: Unremarkable Neck:  6 cm JVD, no thyromegally Back:  No CVA tenderness Lungs:  Clear with no wheezes, rales, or rhonchi. No increased work of breathing HEART:  Regular brady rhythm, no murmurs, no rubs, no clicks Abd:  soft, positive bowel sounds, no organomegally, no rebound, no guarding Ext:  2 plus pulses, no edema, no cyanosis, no clubbing Skin:  No rashes no nodules Neuro:  CN II through XII intact, motor grossly intact  EKG - atrial fib with a controlled VR.  Assess/Plan:

## 2014-12-06 NOTE — Patient Instructions (Signed)
Medication Instructions:  Your physician recommends that you continue on your current medications as directed. Please refer to the Current Medication list given to you today.   Labwork: None ordered   Testing/Procedures: Your physician has recommended that you wear a holter monitor. Holter monitors are medical devices that record the heart's electrical activity. Doctors most often use these monitors to diagnose arrhythmias. Arrhythmias are problems with the speed or rhythm of the heartbeat. The monitor is a small, portable device. You can wear one while you do your normal daily activities. This is usually used to diagnose what is causing palpitations/syncope (passing out).    Follow-Up: Your physician wants you to follow-up in: 6 months with Dr Court Joy will receive a reminder letter in the mail two months in advance. If you don't receive a letter, please call our office to schedule the follow-up appointment.   Any Other Special Instructions Will Be Listed Below (If Applicable).     If you need a refill on your cardiac medications before your next appointment, please call your pharmacy.

## 2014-12-06 NOTE — Assessment & Plan Note (Signed)
I have asked him to have a 24 hour holter to see if he is markedly bradycardic. He has had some dizzy spells.

## 2014-12-06 NOTE — Assessment & Plan Note (Signed)
He has no ventricular pre-excitation. He will continue his low dose amiodarone.

## 2014-12-06 NOTE — Assessment & Plan Note (Signed)
He is now out of rhythm. His rate may be controlled but may not. Will obtain a 24 hour holter. I hope we can maintain a strategy of rate control going forward.

## 2014-12-06 NOTE — Assessment & Plan Note (Signed)
His blood pressure is reasonably well controlled. He will continue his current meds. 

## 2014-12-29 ENCOUNTER — Ambulatory Visit: Payer: Self-pay | Admitting: Internal Medicine

## 2015-06-19 ENCOUNTER — Encounter: Payer: Self-pay | Admitting: Internal Medicine

## 2015-06-19 ENCOUNTER — Ambulatory Visit (INDEPENDENT_AMBULATORY_CARE_PROVIDER_SITE_OTHER): Payer: Medicare Other | Admitting: Internal Medicine

## 2015-06-19 VITALS — BP 160/94 | HR 65 | Ht 71.0 in | Wt 214.8 lb

## 2015-06-19 DIAGNOSIS — I119 Hypertensive heart disease without heart failure: Secondary | ICD-10-CM | POA: Diagnosis not present

## 2015-06-19 DIAGNOSIS — I480 Paroxysmal atrial fibrillation: Secondary | ICD-10-CM

## 2015-06-19 NOTE — Patient Instructions (Signed)
Medication Instructions:  Your physician has recommended you make the following change in your medication:  1) Stop Amiodarone   Labwork: None ordered   Testing/Procedures: None ordered   Follow-Up: Your physician wants you to follow-up in: 6 months with Dr Court Joy will receive a reminder letter in the mail two months in advance. If you don't receive a letter, please call our office to schedule the follow-up appointment.   Any Other Special Instructions Will Be Listed Below (If Applicable).     If you need a refill on your cardiac medications before your next appointment, please call your pharmacy.

## 2015-06-19 NOTE — Progress Notes (Signed)
HPI [name] returns today for followup. 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 He has now developed permanent atrial fib despite amiodarone therapy. He is not symptomatic.His rate remains well controlled. He notes some GI complaints after eating barbecue. He did not take his meds today. Allergies  Allergen Reactions  . Aspirin Other (See Comments)    Unknown  . Cardizem [Diltiazem]     Unknown  . Digoxin And Related Other (See Comments)    unknown  . Diltiazem Hcl     Generalized Edema (intolerance)   . Metoprolol Other (See Comments)    Fluid retention  . Ouabain     Generalized Edema (intolerance)   . Pantoprazole Sodium Other (See Comments)    Stomach pain  . Propafenone Hcl Er Other (See Comments)    H/A, N/V, stomach upset     Current Outpatient Prescriptions  Medication Sig Dispense Refill  . amiodarone (PACERONE) 200 MG tablet Take 200 mg by mouth daily except take 1/2 tablet on Friday, Saturday and Sunday    . amoxicillin (AMOXIL) 500 MG capsule Take 1 capsule by mouth 4 (four) times daily.    . carvedilol (COREG CR) 20 MG 24 hr capsule Take 40 mg by mouth daily.     . fluticasone (FLONASE) 50 MCG/ACT nasal spray Place 2 sprays into both nostrils daily as needed. ALLERGIES    . furosemide (LASIX) 40 MG tablet Take 40 mg by mouth daily as needed (swelling).     . JANTOVEN 6 MG tablet Take 1 tablet by mouth as directed. Five days a week    . losartan (COZAAR) 50 MG tablet Take 1 tablet by mouth daily.    . potassium chloride SA (K-DUR,KLOR-CON) 20 MEQ tablet Take 1 tablet by mouth daily.    . RABEprazole (ACIPHEX) 20 MG tablet Take 20 mg by mouth daily.      No current facility-administered medications for this visit.      ROS:   All systems reviewed and negative except as noted in the  HPI.   Past Surgical History  Procedure Laterality Date  . Dc cardioversion       No family history on file.   Social History   Social History  . Marital Status: Married    Spouse Name: N/A  . Number of Children: N/A  . Years of Education: N/A   Occupational History  . Not on file.   Social History Main Topics  . Smoking status: Former Smoker -- 0.50 packs/day for 10 years    Types: Cigarettes    Quit date: 01/14/1980  . Smokeless tobacco: Not on file     Comment: stopped smoking 20 years ago  . Alcohol Use: No  . Drug Use: Not on file  . Sexual Activity: Not on file   Other Topics Concern  . Not on file   Social History Narrative     BP 160/94 mmHg  Pulse 65  Ht  (1.803 m)  Wt 214 lb 12.8 oz (97.433 kg)  BMI 29.97 kg/m2  Physical Exam:  Well appearing middle-aged man, NAD HEENT: Unremarkable Neck:  6 cm JVD, no thyromegally Back:  No CVA tenderness Lungs:  Clear with no wheezes, rales, or rhonchi. No increased work of breathing HEART:  IRIR rhythm, no murmurs, no rubs, no clicks Abd:  soft, positive bowel sounds,  no organomegally, no rebound, no guarding Ext:  2 plus pulses, no edema, no cyanosis, no clubbing Skin:  No rashes no nodules Neuro:  CN II through XII intact, motor grossly intact  EKG - atrial fib with a controlled VR.  Assess/Plan: 1. Now chronic atrial fib - I have recommended he stop his amio and continue his beta blocker for rate control. He remains on warfarin 2. WPW - I am concerned that he will develop additional pre-excitation but he may not at age 64. I have asked him to call if he feels like his heart is beating much faster or if he gets more sob 3. HTN heart disease - his blood pressure is elevated. He did not take his meds this morning. 4. GI reflux - he is stable with Aciphex. He is warned to avoid barbecue.  Leonia Reeves.D.

## 2015-12-10 ENCOUNTER — Encounter: Payer: Self-pay | Admitting: Internal Medicine

## 2015-12-17 ENCOUNTER — Encounter: Payer: Self-pay | Admitting: Internal Medicine

## 2015-12-17 ENCOUNTER — Ambulatory Visit (INDEPENDENT_AMBULATORY_CARE_PROVIDER_SITE_OTHER): Payer: Medicare Other | Admitting: Internal Medicine

## 2015-12-17 VITALS — BP 152/84 | HR 70 | Ht 72.0 in | Wt 220.2 lb

## 2015-12-17 DIAGNOSIS — R42 Dizziness and giddiness: Secondary | ICD-10-CM

## 2015-12-17 DIAGNOSIS — I4891 Unspecified atrial fibrillation: Secondary | ICD-10-CM

## 2015-12-17 DIAGNOSIS — I480 Paroxysmal atrial fibrillation: Secondary | ICD-10-CM

## 2015-12-17 NOTE — Progress Notes (Signed)
HPI [name] returns today for followup. 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 He has now developed permanent atrial fib despite amiodarone therapy. When I last saw him, I asked him to stop the amiodarone. He returns today for followup. He has had some dizziness for the past few months, often several times a day. He denies frank syncope. Allergies  Allergen Reactions  . Aspirin Other (See Comments)    Unknown  . Cardizem [Diltiazem]     Unknown  . Digoxin And Related Other (See Comments)    unknown  . Diltiazem Hcl     Generalized Edema (intolerance)   . Metoprolol Other (See Comments)    Fluid retention  . Ouabain     Generalized Edema (intolerance)   . Pantoprazole Sodium Other (See Comments)    Stomach pain  . Propafenone Hcl Er Other (See Comments)    H/A, N/V, stomach upset     Current Outpatient Prescriptions  Medication Sig Dispense Refill  . amoxicillin (AMOXIL) 500 MG capsule Take 1 capsule by mouth 4 (four) times daily.    Marland Kitchen. atorvastatin (LIPITOR) 10 MG tablet Take 10 mg by mouth daily.    . carvedilol (COREG CR) 20 MG 24 hr capsule Take 40 mg by mouth daily.     . fluticasone (FLONASE) 50 MCG/ACT nasal spray Place 2 sprays into both nostrils daily as needed. ALLERGIES    . furosemide (LASIX) 40 MG tablet Take 40 mg by mouth daily as needed (swelling).     . JANTOVEN 6 MG tablet Take 1 tablet by mouth as directed. Five days a week    . losartan (COZAAR) 50 MG tablet Take 1 tablet by mouth daily.    . potassium chloride SA (K-DUR,KLOR-CON) 20 MEQ tablet Take 1 tablet by mouth daily.    . RABEprazole (ACIPHEX) 20 MG tablet Take 20 mg by mouth daily.      No current facility-administered medications for this visit.       ROS:   All systems reviewed and negative except as noted in  the HPI.   Past Surgical History:  Procedure Laterality Date  . DC cardioversion       History reviewed. No pertinent family history.   Social History   Social History  . Marital status: Married    Spouse name: N/A  . Number of children: N/A  . Years of education: N/A   Occupational History  . Not on file.   Social History Main Topics  . Smoking status: Former Smoker    Packs/day: 0.50    Years: 10.00    Types: Cigarettes    Quit date: 01/14/1980  . Smokeless tobacco: Never Used     Comment: stopped smoking 20 years ago  . Alcohol use No  . Drug use: No  . Sexual activity: Not on file   Other Topics Concern  . Not on file   Social History Narrative  . No narrative on file     BP (!) 152/84   Pulse 70   Ht 6' (1.829 m)   Wt 220 lb 3.2 oz (99.9 kg)   SpO2 98%   BMI 29.86 kg/m   Physical Exam:  Well appearing middle-aged man, NAD HEENT: Unremarkable Neck:  6 cm JVD, no thyromegally Back:  No CVA tenderness Lungs:  Clear with no wheezes,  rales, or rhonchi. No increased work of breathing HEART:  IRIR rhythm, no murmurs, no rubs, no clicks Abd:  soft, positive bowel sounds, no organomegally, no rebound, no guarding Ext:  2 plus pulses, no edema, no cyanosis, no clubbing Skin:  No rashes no nodules Neuro:  CN II through XII intact, motor grossly intact  EKG - atrial fib with a controlled VR.  Assess/Plan: 1. chronic atrial fib - He will continue his beta blocker for rate control. He remains on warfarin 2. WPW - he has been off of amiodarone for 6 months and today his ECG shows no evidence of ventricular pre-excitation. 3. HTN heart disease - his blood pressure is elevated but he states that at home it is well controlled. 4. Dizziness - This could be related to a slow VR in atrial fib. We will have him wear a 48 hour holter. Additional rec's based on the results of the holter.  [name] [name].D.

## 2015-12-17 NOTE — Patient Instructions (Addendum)
Medication Instructions:  Your physician recommends that you continue on your current medications as directed. Please refer to the Current Medication list given to you today.  Labwork: None Ordered   Testing/Procedures: Your physician has recommended that you wear a 48 hour holter monitor. Holter monitors are medical devices that record the heart's electrical activity. Doctors most often use these monitors to diagnose arrhythmias. Arrhythmias are problems with the speed or rhythm of the heartbeat. The monitor is a small, portable device. You can wear one while you do your normal daily activities. This is usually used to diagnose what is causing palpitations/syncope (passing out).    Follow-Up: To be determined after results of holter moitor  Any Other Special Instructions Will Be Listed Below (If Applicable).     If you need a refill on your cardiac medications before your next appointment, please call your pharmacy.

## 2015-12-21 ENCOUNTER — Ambulatory Visit (INDEPENDENT_AMBULATORY_CARE_PROVIDER_SITE_OTHER): Payer: Medicare Other

## 2015-12-21 DIAGNOSIS — I4891 Unspecified atrial fibrillation: Secondary | ICD-10-CM | POA: Diagnosis not present

## 2015-12-21 DIAGNOSIS — R42 Dizziness and giddiness: Secondary | ICD-10-CM

## 2016-01-21 ENCOUNTER — Telehealth: Payer: Self-pay | Admitting: Internal Medicine

## 2016-01-21 NOTE — Telephone Encounter (Signed)
Called, unable to reach pt. Left voice message holter monitor report received. Awaiting Dr. Lubertha Basque recommendations. Informed I will call pt once complete.

## 2016-01-21 NOTE — Telephone Encounter (Signed)
Received incoming call. Informed I will call pt back after Dr. Ladona Ridgel makes his recommendation r/t holter results. Pt verbalized understanding.

## 2016-01-21 NOTE — Telephone Encounter (Signed)
New message ° ° ° ° ° °Calling to get monitor results °

## 2016-01-24 NOTE — Telephone Encounter (Signed)
Called, spoke with pt. Informed pt of monitor results. Dr. Lubertha Basque recommendation is no changes at this time. Follow-up in 6 months. Informed pt to call our office if he experiences any symptoms before that time, and we will get pt in for sooner appt. Pt verbalized understanding nd thanked me for calling.

## 2016-02-12 ENCOUNTER — Other Ambulatory Visit: Payer: Self-pay | Admitting: *Deleted

## 2016-02-12 MED ORDER — CARVEDILOL PHOSPHATE ER 20 MG PO CP24
40.0000 mg | ORAL_CAPSULE | Freq: Every day | ORAL | 3 refills | Status: DC
Start: 2016-02-12 — End: 2023-03-31

## 2016-02-12 NOTE — Telephone Encounter (Signed)
I do not see where Dr Ladona Ridgel has ever refilled this for the patient. Okay to refill? Please advise. Thanks, MI

## 2016-07-01 ENCOUNTER — Telehealth: Payer: Self-pay | Admitting: Internal Medicine

## 2016-07-01 DIAGNOSIS — R0602 Shortness of breath: Secondary | ICD-10-CM

## 2016-07-01 DIAGNOSIS — I482 Chronic atrial fibrillation, unspecified: Secondary | ICD-10-CM

## 2016-07-01 NOTE — Telephone Encounter (Signed)
New message     Pt wife is calling stating that pt is in afib. She states that pt has been since last Friday. She is asking for a call back from RN.

## 2016-07-01 NOTE — Telephone Encounter (Signed)
Called, spoke with pt's wife, Erie Noe (on [place]). Erie Noe informed pt feels like he is in a-fib. Pt is having slight weakness, dizziness, and SOB. She could not give me any further details r/t to pt's symptoms or when started. Vanessa found BP reading in BP machine, but not sure of dates taken - 150/112, 148/101, 143/95. Erie Noe stated pt's HR has been ranging from 59 - 95 or 100.  Informed Dr. Ladona Ridgel will be back in the office on Friday. Requested call back from pt with exact details. If pt feels issue needs to be addressed prior to Friday (6/22), please call our office. If I don't hear back, I will contact pt back on Thursday or Friday. Erie Noe verbalized understanding.

## 2016-07-04 NOTE — Telephone Encounter (Signed)
Called, pt unavailable. Left detailed voice message message for pt. Informed Dr. Ladona Ridgel recommended pt  have an echo, as well as move up f/u appt (which is currently scheduled for 09/02/16. Requested call back to discuss. Informed someone from our office will call to set up echo appt and move next appt to a sooner date with Dr. Ladona Ridgel.

## 2016-07-17 ENCOUNTER — Ambulatory Visit (HOSPITAL_COMMUNITY): Payer: Medicare Other | Attending: Cardiology

## 2016-07-17 ENCOUNTER — Other Ambulatory Visit: Payer: Self-pay

## 2016-07-17 DIAGNOSIS — I429 Cardiomyopathy, unspecified: Secondary | ICD-10-CM | POA: Diagnosis not present

## 2016-07-17 DIAGNOSIS — R0602 Shortness of breath: Secondary | ICD-10-CM | POA: Insufficient documentation

## 2016-07-17 DIAGNOSIS — I10 Essential (primary) hypertension: Secondary | ICD-10-CM | POA: Insufficient documentation

## 2016-07-17 DIAGNOSIS — I081 Rheumatic disorders of both mitral and tricuspid valves: Secondary | ICD-10-CM | POA: Diagnosis not present

## 2016-07-17 DIAGNOSIS — R001 Bradycardia, unspecified: Secondary | ICD-10-CM | POA: Insufficient documentation

## 2016-07-17 DIAGNOSIS — I482 Chronic atrial fibrillation, unspecified: Secondary | ICD-10-CM

## 2016-07-17 DIAGNOSIS — I272 Pulmonary hypertension, unspecified: Secondary | ICD-10-CM | POA: Insufficient documentation

## 2016-07-28 ENCOUNTER — Encounter (INDEPENDENT_AMBULATORY_CARE_PROVIDER_SITE_OTHER): Payer: Self-pay

## 2016-07-28 ENCOUNTER — Ambulatory Visit (INDEPENDENT_AMBULATORY_CARE_PROVIDER_SITE_OTHER): Payer: Medicare Other | Admitting: Internal Medicine

## 2016-07-28 ENCOUNTER — Encounter: Payer: Self-pay | Admitting: Internal Medicine

## 2016-07-28 VITALS — BP 146/98 | HR 113 | Ht 71.0 in | Wt 223.6 lb

## 2016-07-28 DIAGNOSIS — I480 Paroxysmal atrial fibrillation: Secondary | ICD-10-CM | POA: Diagnosis not present

## 2016-07-28 DIAGNOSIS — I4891 Unspecified atrial fibrillation: Secondary | ICD-10-CM

## 2016-07-28 NOTE — Progress Notes (Signed)
HPI [name] returns today for followup. 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 He has now developed permanent atrial fib despite amiodarone therapy. When I last saw him, I asked him to stop the amiodarone. He returns today for followup. Since I saw him last, his dizziness has improved but he notes non-cardiac chest pain which is not related to exertion. He notes that he has been busier than usual helping to take care of his mother who has dementia. He denies frank syncope. Minimal palpitations. No syncope.  Allergies  Allergen Reactions  . Aspirin Other (See Comments)    Unknown  . Cardizem [Diltiazem]     Unknown  . Digoxin And Related Other (See Comments)    unknown  . Diltiazem Hcl     Generalized Edema (intolerance)   . Metoprolol Other (See Comments)    Fluid retention  . Ouabain     Generalized Edema (intolerance)   . Pantoprazole Sodium Other (See Comments)    Stomach pain  . Propafenone Hcl Er Other (See Comments)    H/A, N/V, stomach upset     Current Outpatient Prescriptions  Medication Sig Dispense Refill  . amoxicillin (AMOXIL) 500 MG capsule Take 1 capsule by mouth 4 (four) times daily.    Marland Kitchen atorvastatin (LIPITOR) 10 MG tablet Take 10 mg by mouth daily.    . carvedilol (COREG CR) 20 MG 24 hr capsule Take 2 capsules (40 mg total) by mouth daily. 180 capsule 3  . fluticasone (FLONASE) 50 MCG/ACT nasal spray Place 2 sprays into both nostrils daily as needed. ALLERGIES    . furosemide (LASIX) 40 MG tablet Take 40 mg by mouth daily as needed (swelling).     . JANTOVEN 6 MG tablet Take 1 tablet by mouth as directed. Five days a week    . potassium chloride SA (K-DUR,KLOR-CON) 20 MEQ tablet Take 1 tablet by mouth daily.    . RABEprazole (ACIPHEX) 20 MG tablet Take 20 mg by mouth daily.     Marland Kitchen  losartan (COZAAR) 50 MG tablet Take 1 tablet by mouth daily.     No current facility-administered medications for this visit.       ROS:   All systems reviewed and negative except as noted in the HPI.   Past Surgical History:  Procedure Laterality Date  . DC cardioversion       No family history on file.   Social History   Social History  . Marital status: Married    Spouse name: N/A  . Number of children: N/A  . Years of education: N/A   Occupational History  . Not on file.   Social History Main Topics  . Smoking status: Former Smoker    Packs/day: 0.50    Years: 10.00    Types: Cigarettes    Quit date: 01/14/1980  . Smokeless tobacco: Never Used     Comment: stopped smoking 20 years ago  . Alcohol use No  . Drug use: No  . Sexual activity: Not on file   Other Topics Concern  . Not on file   Social History Narrative  . No narrative on file     BP (!) 146/98   Pulse (!) 113   Ht 5\' 11"  (1.803 m)   Wt 223 lb 9.6 oz (101.4 kg)   SpO2 99%  BMI 31.19 kg/m   Physical Exam:  Well appearing middle-aged man, NAD HEENT: Unremarkable Neck:  6 cm JVD, no thyromegally Back:  No CVA tenderness Lungs:  Clear with no wheezes, rales, or rhonchi. No increased work of breathing HEART:  IRIR rhythm, no murmurs, no rubs, no clicks Abd:  soft, positive bowel sounds, no organomegally, no rebound, no guarding Ext:  2 plus pulses, no edema, no cyanosis, no clubbing Skin:  No rashes no nodules Neuro:  CN II through XII intact, motor grossly intact  EKG - atrial fib with a controlled VR.  Assess/Plan: 1. chronic atrial fib - He will continue his beta blocker for rate control. He remains on warfarin. I considered increasing his dose of coreg but will hold off on uptitration for now. 2. WPW - he has been off of amiodarone for over a year and his ECG shows no evidence of pre-excitation. 3. HTN heart disease - his blood pressure is elevated but he states that at home it  is well controlled. I have asked him to make a log with the date and time and blood pressure. 4. Chest pressure - this has been worked up before and unrevealing. His symptoms are non-exertional. I have asked him to try and get better control of his pressures and to call if his chest pressure worsens.   Leonia Reeves.D.

## 2016-07-28 NOTE — Patient Instructions (Addendum)
Medication Instructions:  Your physician has recommended you make the following change in your medication:     Labwork: None Ordered   Testing/Procedures: None Ordered   Follow-Up: Your physician recommends that you schedule a follow-up appointment in: 4 months with Dr. Ladona Ridgel     Any Other Special Instructions Will Be Listed Below (If Applicable).     If you need a refill on your cardiac medications before your next appointment, please call your pharmacy.

## 2016-08-14 ENCOUNTER — Ambulatory Visit: Payer: Medicare Other | Admitting: Internal Medicine

## 2016-09-02 ENCOUNTER — Ambulatory Visit: Payer: Medicare Other | Admitting: Internal Medicine

## 2016-09-30 ENCOUNTER — Telehealth: Payer: Self-pay | Admitting: Internal Medicine

## 2016-09-30 NOTE — Telephone Encounter (Signed)
Call returned to Pt.  Per Dr. Ladona Ridgel, [place] or Eliquis are both ok to take for a fib.  Encouraged Pt to determine if he could afford monthly copay of Eliquis before making the change.  Pt indicates understanding.  No further questions.

## 2016-09-30 NOTE — Telephone Encounter (Signed)
New message    Pt is calling asking for a call back. He said his other doctor is wanting to change his blood thinner medication please call.

## 2016-11-25 ENCOUNTER — Encounter: Payer: Self-pay | Admitting: Internal Medicine

## 2016-11-25 ENCOUNTER — Encounter (INDEPENDENT_AMBULATORY_CARE_PROVIDER_SITE_OTHER): Payer: Self-pay

## 2016-11-25 ENCOUNTER — Ambulatory Visit: Payer: Medicare Other | Admitting: Internal Medicine

## 2016-11-25 VITALS — BP 126/72 | HR 55 | Ht 72.0 in | Wt 219.0 lb

## 2016-11-25 DIAGNOSIS — I482 Chronic atrial fibrillation, unspecified: Secondary | ICD-10-CM

## 2016-11-25 DIAGNOSIS — I456 Pre-excitation syndrome: Secondary | ICD-10-CM

## 2016-11-25 DIAGNOSIS — I10 Essential (primary) hypertension: Secondary | ICD-10-CM | POA: Diagnosis not present

## 2016-11-25 NOTE — Progress Notes (Signed)
HPI [name] returns today for ongoing evaluation of chest pressure and atrial fib and HTN. In the interim, he was stable except for problems with elevated blood pressures and he has noted some GI bleeding with small amounts of bright red blood in his stool on Xarelto. In addition, he has been having some dizzy spells. No frank syncope Allergies  Allergen Reactions  . Aspirin Other (See Comments)    Unknown  . Cardizem [Diltiazem]     Unknown  . Digoxin And Related Other (See Comments)    unknown  . Diltiazem Hcl     Generalized Edema (intolerance)   . Metoprolol Other (See Comments)    Fluid retention  . Ouabain     Generalized Edema (intolerance)   . Pantoprazole Sodium Other (See Comments)    Stomach pain  . Propafenone Hcl Er Other (See Comments)    H/A, N/V, stomach upset     Current Outpatient Medications  Medication Sig Dispense Refill  . amoxicillin (AMOXIL) 500 MG capsule Take 1 capsule by mouth 4 (four) times daily.    Marland Kitchen atorvastatin (LIPITOR) 10 MG tablet Take 10 mg by mouth daily.    . carvedilol (COREG CR) 20 MG 24 hr capsule Take 2 capsules (40 mg total) by mouth daily. 180 capsule 3  . fluticasone (FLONASE) 50 MCG/ACT nasal spray Place 2 sprays into both nostrils daily as needed. ALLERGIES    . furosemide (LASIX) 40 MG tablet Take 40 mg by mouth daily as needed (swelling).     Marland Kitchen losartan (COZAAR) 50 MG tablet Take 1 tablet by mouth daily.    . potassium chloride SA (K-DUR,KLOR-CON) 20 MEQ tablet Take 1 tablet by mouth daily.    . RABEprazole (ACIPHEX) 20 MG tablet Take 20 mg by mouth daily.     . Rivaroxaban (XARELTO) 15 MG TABS tablet Take 15 mg daily by mouth.     No current facility-administered medications for this visit.      Past Medical History:  Diagnosis Date  . ATRIAL FIBRILLATION 06/26/2008   Qualifier: Diagnosis of  By: Ladona Ridgel, MD, Jerrell Mylar   . BRADYCARDIA 06/06/2008   Qualifier: Diagnosis of  By: Flonnie Overman    .  CARDIOMYOPATHY, SECONDARY 06/06/2008   Qualifier: Diagnosis of  By: Flonnie Overman    . Essential hypertension 06/06/2008   Qualifier: Diagnosis of  By: Flonnie Overman    . SINUSITIS 06/06/2008   Qualifier: Diagnosis of  By: Flonnie Overman    . 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   Past Surgical History:  Procedure Laterality Date  . DC cardioversion       No family history on file.   Social History   Socioeconomic History  . Marital status: Married    Spouse name: Not on file  . Number of children: Not on file  . Years of education: Not on file  . Highest education level: Not on file  Social Needs  . Financial resource strain: Not on file  . Food insecurity - worry: Not on file  . Food insecurity - inability: Not on file  . Transportation needs - medical: Not on file  . Transportation needs - non-medical: Not on file  Occupational History  . Not on file  Tobacco Use  . Smoking status: Former  Smoker    Packs/day: 0.50    Years: 10.00    Pack years: 5.00    Types: Cigarettes    Last attempt to quit: 01/14/1980    Years since quitting: 36.8  . Smokeless tobacco: Never Used  . Tobacco comment: stopped smoking 20 years ago  Substance and Sexual Activity  . Alcohol use: No  . Drug use: No  . Sexual activity: Not on file  Other Topics Concern  . Not on file  Social History Narrative  . Not on file     BP 126/72   Pulse (!) 55   Ht 6' (1.829 m)   Wt 219 lb (99.3 kg)   SpO2 98%   BMI 29.70 kg/m   Physical Exam:  Well appearing 66 yo man, NAD HEENT: Unremarkable Neck:  No JVD, no thyromegally Lymphatics:  No adenopathy Back:  No CVA tenderness Lungs:  Clear with no wheezes HEART:  IRegular rate rhythm, no murmurs, no rubs, no clicks Abd:  soft, positive bowel sounds, no organomegally, no rebound, no  guarding Ext:  2 plus pulses, no edema, no cyanosis, no clubbing Skin:  No rashes no nodules Neuro:  CN II through XII intact, motor grossly intact  EKG - atrial fib with a slow VR with pauses  Assess/Plan: 1. Atrial fib with a controlled VR - his atrial fib is now chronic. I suspect he will remain slow and will ultimately require PPM insertion. I have asked him to check a 48 hour holter in several weeks. 2. HTN - his pressures at home has been high. I may need to adjust his coreg again. 3. Dizzy spells - I have reviewed that this is likely due to his pauses. We will see what his heart monitor shows. 4. GI bleeding - his history is that this is a small amount of blood. If we stop his xarelto, his stroke risk is fairly high. For this reason, I have asked that he consider undergoing colonoscopy. He will see his medical MD tomorrow and hopefully can be scheduled. There is no evidence that this is hemodynamically signficant.   Leonia ReevesGregg Dyron Kawano,M.D.

## 2016-11-25 NOTE — Patient Instructions (Addendum)
Medication Instructions:  Your physician recommends that you continue on your current medications as directed. Please refer to the Current Medication list given to you today.  Labwork: None ordered.  Testing/Procedures: Your physician has recommended that you wear a holter monitor. Holter monitors are medical devices that record the heart's electrical activity. Doctors most often use these monitors to diagnose arrhythmias. Arrhythmias are problems with the speed or rhythm of the heartbeat. The monitor is a small, portable device. You can wear one while you do your normal daily activities. This is usually used to diagnose what is causing palpitations/syncope (passing out).  Please schedule for a 48 hour holter monitor after Thanksgiving but before Christmas.  Follow-Up: Your physician wants you to follow-up after your holter monitor results are available.     Any Other Special Instructions Will Be Listed Below (If Applicable).   If you need a refill on your cardiac medications before your next appointment, please call your pharmacy.

## 2016-12-26 ENCOUNTER — Ambulatory Visit (INDEPENDENT_AMBULATORY_CARE_PROVIDER_SITE_OTHER): Payer: Medicare Other

## 2016-12-26 DIAGNOSIS — I456 Pre-excitation syndrome: Secondary | ICD-10-CM | POA: Diagnosis not present

## 2016-12-26 DIAGNOSIS — I482 Chronic atrial fibrillation, unspecified: Secondary | ICD-10-CM

## 2016-12-26 DIAGNOSIS — I10 Essential (primary) hypertension: Secondary | ICD-10-CM

## 2017-01-08 ENCOUNTER — Ambulatory Visit: Payer: Medicare Other | Admitting: Internal Medicine

## 2017-01-08 ENCOUNTER — Encounter: Payer: Self-pay | Admitting: Internal Medicine

## 2017-01-08 VITALS — BP 138/98 | HR 52 | Ht 71.0 in | Wt 216.0 lb

## 2017-01-08 DIAGNOSIS — R42 Dizziness and giddiness: Secondary | ICD-10-CM | POA: Diagnosis not present

## 2017-01-08 DIAGNOSIS — I10 Essential (primary) hypertension: Secondary | ICD-10-CM

## 2017-01-08 DIAGNOSIS — I482 Chronic atrial fibrillation, unspecified: Secondary | ICD-10-CM

## 2017-01-08 DIAGNOSIS — R001 Bradycardia, unspecified: Secondary | ICD-10-CM | POA: Diagnosis not present

## 2017-01-08 LAB — CBC WITH DIFFERENTIAL/PLATELET
BASOS ABS: 0 10*3/uL (ref 0.0–0.2)
Basos: 1 %
EOS (ABSOLUTE): 0.1 10*3/uL (ref 0.0–0.4)
Eos: 3 %
Hematocrit: 45.3 % (ref 37.5–51.0)
Hemoglobin: 15.2 g/dL (ref 13.0–17.7)
Immature Grans (Abs): 0 10*3/uL (ref 0.0–0.1)
Immature Granulocytes: 0 %
LYMPHS ABS: 1.5 10*3/uL (ref 0.7–3.1)
LYMPHS: 32 %
MCH: 30.5 pg (ref 26.6–33.0)
MCHC: 33.6 g/dL (ref 31.5–35.7)
MCV: 91 fL (ref 79–97)
Monocytes Absolute: 0.5 10*3/uL (ref 0.1–0.9)
Monocytes: 10 %
NEUTROS ABS: 2.5 10*3/uL (ref 1.4–7.0)
Neutrophils: 54 %
Platelets: 238 10*3/uL (ref 150–379)
RBC: 4.99 x10E6/uL (ref 4.14–5.80)
RDW: 15.1 % (ref 12.3–15.4)
WBC: 4.6 10*3/uL (ref 3.4–10.8)

## 2017-01-08 LAB — BASIC METABOLIC PANEL
BUN/Creatinine Ratio: 11 (ref 10–24)
BUN: 14 mg/dL (ref 8–27)
CHLORIDE: 104 mmol/L (ref 96–106)
CO2: 25 mmol/L (ref 20–29)
Calcium: 9.6 mg/dL (ref 8.6–10.2)
Creatinine, Ser: 1.22 mg/dL (ref 0.76–1.27)
GFR calc non Af Amer: 61 mL/min/{1.73_m2} (ref >59)
GFR, EST AFRICAN AMERICAN: 70 mL/min/{1.73_m2} (ref >59)
GLUCOSE: 87 mg/dL (ref 65–99)
POTASSIUM: 4.4 mmol/L (ref 3.5–5.2)
Sodium: 144 mmol/L (ref 134–144)

## 2017-01-08 NOTE — Progress Notes (Signed)
HPI Mr. Oscarson returns today for ongoing evaluation of chest pressure and atrial fib and HTN. In the interim, he was stable except for problems with elevated blood pressures and dizziness.  No frank syncope. He wore a cardiac monitor which demonstrated mostly nocturnal but some daytime bradycardia. He has not had frank syncope. He has pauses while asleep of over 7 seconds but he has also had daytime pauses of 5 seconds. He gets dizzy with these but has not passed out.       Allergies    Allergies  Allergen Reactions  . Aspirin Other (See Comments)    Unknown  . Cardizem [Diltiazem]     Unknown  . Digoxin And Related Other (See Comments)    unknown  . Diltiazem Hcl     Generalized Edema (intolerance)   . Metoprolol Other (See Comments)    Fluid retention  . Ouabain     Generalized Edema (intolerance)   . Pantoprazole Sodium Other (See Comments)    Stomach pain  . Propafenone Hcl Er Other (See Comments)    H/A, N/V, stomach upset     Current Outpatient Medications  Medication Sig Dispense Refill  . atorvastatin (LIPITOR) 10 MG tablet Take 10 mg by mouth daily.    . carvedilol (COREG CR) 20 MG 24 hr capsule Take 2 capsules (40 mg total) by mouth daily. 180 capsule 3  . fluticasone (FLONASE) 50 MCG/ACT nasal spray Place 2 sprays into both nostrils daily as needed. ALLERGIES    . furosemide (LASIX) 40 MG tablet Take 40 mg by mouth daily as needed (swelling).     Marland Kitchen losartan (COZAAR) 50 MG tablet Take 1 tablet by mouth daily.    . potassium chloride SA (K-DUR,KLOR-CON) 20 MEQ tablet Take 1 tablet by mouth daily.    . RABEprazole (ACIPHEX) 20 MG tablet Take 20 mg by mouth daily.     . Rivaroxaban (XARELTO) 15 MG TABS tablet Take 15 mg daily by mouth.     No current facility-administered medications for this visit.      Past Medical History:  Diagnosis Date  . ATRIAL FIBRILLATION 06/26/2008   Qualifier: Diagnosis of  By: Ladona Ridgel, MD, Jerrell Mylar   . BRADYCARDIA  06/06/2008   Qualifier: Diagnosis of  By: Flonnie Overman    . CARDIOMYOPATHY, SECONDARY 06/06/2008   Qualifier: Diagnosis of  By: Flonnie Overman    . Essential hypertension 06/06/2008   Qualifier: Diagnosis of  By: Flonnie Overman    . SINUSITIS 06/06/2008   Qualifier: Diagnosis of  By: Flonnie Overman    . 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   Past Surgical History:  Procedure Laterality Date  . DC cardioversion       /FHX - no premature CAD, pos for HTN  Social History   Socioeconomic History  . Marital status: Married    Spouse name: Not on file  . Number of children: Not on file  . Years of education: Not on file  . Highest education level: Not on file  Social Needs  . Financial resource strain: Not on file  . Food insecurity - worry: Not on file  . Food insecurity - inability: Not on file  . Transportation needs - medical: Not on file  . Transportation needs - non-medical: Not on  file  Occupational History  . Not on file  Tobacco Use  . Smoking status: Former Smoker    Packs/day: 0.50    Years: 10.00    Pack years: 5.00    Types: Cigarettes    Last attempt to quit: 01/14/1980    Years since quitting: 37.0  . Smokeless tobacco: Never Used  . Tobacco comment: stopped smoking 20 years ago  Substance and Sexual Activity  . Alcohol use: No  . Drug use: No  . Sexual activity: Not on file  Other Topics Concern  . Not on file  Social History Narrative  . Not on file     Ht 5' 11" (1.803 m)   Wt 216 lb (98 kg)   BMI 30.13 kg/m   Physical Exam:  Well appearing middle aged man, NAD HEENT: Unremarkable Neck:  6 cm JVD, no thyromegally Lymphatics:  No adenopathy Back:  No CVA tenderness Lungs:  Clear with no wheezes HEART:  IRegular rate rhythm, no murmurs, no rubs, no clicks Abd:  soft, positive bowel  sounds, no organomegally, no rebound, no guarding Ext:  2 plus pulses, no edema, no cyanosis, no clubbing Skin:  No rashes no nodules Neuro:  CN II through XII intact, motor grossly intact  EKG - atrial fib with a slow VR   Assess/Plan: 1. Atrial fib - his ventricular rate is slow. I have recommended he undergo PPM insertion and I have discussed the indications/risks/benefits/goals/expectations of PPM insertion and he wishes to proceed. I have asked that he avoid driving until his PPM is inserted. 2. HTN - we discussed trying to avoid PPM insertion by stopping his Coreg. 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 He also has had very refractory HTN and needs coreg. 3. Diastolic heart failure - he has a h/o severe LV dysfunction which normalized with control of his ventricular rates. He will maintain a low sodium diet. He will continue his current meds. I spent over 25 minutes including over 50% face to face time with the patient. Gregg Taylor,M.D. 

## 2017-01-08 NOTE — Patient Instructions (Addendum)
Medication Instructions:  Your physician recommends that you continue on your current medications as directed. Please refer to the Current Medication list given to you today.  Labwork: You will get lab work today:  BMP and CBC  Testing/Procedures: Your physician has recommended that you have a pacemaker inserted. A pacemaker is a small device that is placed under the skin of your chest or abdomen to help control abnormal heart rhythms. This device uses electrical pulses to prompt the heart to beat at a normal rate. Pacemakers are used to treat heart rhythms that are too slow. Wire (leads) are attached to the pacemaker that goes into the chambers of you heart. This is done in the hospital and usually requires and overnight stay. Please see the instruction sheet given to you today for more information.  Follow-Up: You will follow up with the device clinic 10-14 days after your procedure for a wound check.  You will follow up with Dr. Ladona Ridgelaylor 91 days after your procedure.  Any Other Special Instructions Will Be Listed Below (If Applicable).  [facility] at:  January 22, 2017 at 1:30 pm. Use the CHG scrub as directed. You may have a small light breakfast before 7 am on the morning of your procedure.  Nothing to eat after 7 am. On the morning of your procedure you may take your carvedilol, losartan, raheprazole and lipitor with a sip of water. Hold your Xarelto 2 days prior to your procedure:   Last dose January 19, 2017.  Then hold. Plan for one night stay.  You will need someone to drive you home at discharge.   If you need a refill on your cardiac medications before your next appointment, please call your pharmacy.   Pacemaker Implantation, Adult Pacemaker implantation is a procedure to place a pacemaker inside your chest. A pacemaker is a small computer that sends electrical signals to the heart and helps your heart beat normally. A  pacemaker also stores information about your heart rhythms. You may need pacemaker implantation if you:  Have a slow heartbeat (bradycardia).  Faint (syncope).  Have shortness of breath (dyspnea) due to heart problems.  The pacemaker attaches to your heart through a wire, called a lead. Sometimes just one lead is needed. Other times, there will be two leads. There are two types of pacemakers:  Transvenous pacemaker. This type is placed under the skin or muscle of your chest. The lead goes through a vein in the chest area to reach the inside of the heart.  Epicardial pacemaker. This type is placed under the skin or muscle of your chest or belly. The lead goes through your chest to the outside of the heart.  Tell a health care provider about:  Any allergies you have.  All medicines you are taking, including vitamins, herbs, eye drops, creams, and over-the-counter medicines.  Any problems you or family members have had with anesthetic medicines.  Any blood or bone disorders you have.  Any surgeries you have had.  Any medical conditions you have.  Whether you are pregnant or may be pregnant. What are the risks? Generally, this is a safe procedure. However, problems may occur, including:  Infection.  Bleeding.  Failure of the pacemaker or the lead.  Collapse of a lung or bleeding into a lung.  Blood clot inside a blood vessel with a lead.  Damage to the heart.  Infection inside the heart (endocarditis).  Allergic reactions to  medicines.  What happens before the procedure? Staying hydrated Follow instructions from your health care provider about hydration, which may include:  Up to 2 hours before the procedure - you may continue to drink clear liquids, such as water, clear fruit juice, black coffee, and plain tea.  Eating and drinking restrictions Follow instructions from your health care provider about eating and drinking, which may include:  8 hours before the  procedure - stop eating heavy meals or foods such as meat, fried foods, or fatty foods.  6 hours before the procedure - stop eating light meals or foods, such as toast or cereal.  6 hours before the procedure - stop drinking milk or drinks that contain milk.  2 hours before the procedure - stop drinking clear liquids.  Medicines  Ask your health care provider about: ? Changing or stopping your regular medicines. This is especially important if you are taking diabetes medicines or blood thinners. ? Taking medicines such as aspirin and ibuprofen. These medicines can thin your blood. Do not take these medicines before your procedure if your health care provider instructs you not to.  You may be given antibiotic medicine to help prevent infection. General instructions  You will have a heart evaluation. This may include an electrocardiogram (ECG), chest X-ray, and heart imaging (echocardiogram,  or echo) tests.  You will have blood tests.  Do not use any products that contain nicotine or tobacco, such as cigarettes and e-cigarettes. If you need help quitting, ask your health care provider.  Plan to have someone take you home from the hospital or clinic.  If you will be going home right after the procedure, plan to have someone with you for 24 hours.  Ask your health care provider how your surgical site will be marked or identified. What happens during the procedure?  To reduce your risk of infection: ? Your health care team will wash or sanitize their hands. ? Your skin will be washed with soap. ? Hair may be removed from the surgical area.  An IV tube will be inserted into one of your veins.  You will be given one or more of the following: ? A medicine to help you relax (sedative). ? A medicine to numb the area (local anesthetic). ? A medicine to make you fall asleep (general anesthetic).  If you are getting a transvenous pacemaker: ? An incision will be made in your upper  chest. ? A pocket will be made for the pacemaker. It may be placed under the skin or between layers of muscle. ? The lead will be inserted into a blood vessel that returns to the heart. ? While X-rays are taken by an imaging machine (fluoroscopy), the lead will be advanced through the vein to the inside of your heart. ? The other end of the lead will be tunneled under the skin and attached to the pacemaker.  If you are getting an epicardial pacemaker: ? An incision will be made near your ribs or breastbone (sternum) for the lead. ? The lead will be attached to the outside of your heart. ? Another incision will be made in your chest or upper belly to create a pocket for the pacemaker. ? The free end of the lead will be tunneled under the skin and attached to the pacemaker.  The transvenous or epicardial pacemaker will be tested. Imaging studies may be done to check the lead position.  The incisions will be closed with stitches (sutures), adhesive strips, or skin  glue.  Bandages (dressing) will be placed over the incisions. The procedure may vary among health care providers and hospitals. What happens after the procedure?  Your blood pressure, heart rate, breathing rate, and blood oxygen level will be monitored until the medicines you were given have worn off.  You will be given antibiotics and pain medicine.  ECG and chest x-rays will be done.  You will wear a continuous type of ECG (Holter monitor) to check your heart rhythm.  Your health care provider willprogram the pacemaker.  Do not drive for 24 hours if you received a sedative. This information is not intended to replace advice given to you by your health care provider. Make sure you discuss any questions you have with your health care provider. Document Released: 12/20/2001 Document Revised: 07/20/2015 Document Reviewed: 06/13/2015 Elsevier Interactive Patient Education  [employer].

## 2017-01-08 NOTE — H&P (View-Only) (Signed)
HPI [name] returns today for ongoing evaluation of chest pressure and atrial fib and HTN. In the interim, he was stable except for problems with elevated blood pressures and dizziness.  No frank syncope. He wore a cardiac monitor which demonstrated mostly nocturnal but some daytime bradycardia. He has not had frank syncope. He has pauses while asleep of over 7 seconds but he has also had daytime pauses of 5 seconds. He gets dizzy with these but has not passed out.       Allergies    Allergies  Allergen Reactions  . Aspirin Other (See Comments)    Unknown  . Cardizem [Diltiazem]     Unknown  . Digoxin And Related Other (See Comments)    unknown  . Diltiazem Hcl     Generalized Edema (intolerance)   . Metoprolol Other (See Comments)    Fluid retention  . Ouabain     Generalized Edema (intolerance)   . Pantoprazole Sodium Other (See Comments)    Stomach pain  . Propafenone Hcl Er Other (See Comments)    H/A, N/V, stomach upset     Current Outpatient Medications  Medication Sig Dispense Refill  . atorvastatin (LIPITOR) 10 MG tablet Take 10 mg by mouth daily.    . carvedilol (COREG CR) 20 MG 24 hr capsule Take 2 capsules (40 mg total) by mouth daily. 180 capsule 3  . fluticasone (FLONASE) 50 MCG/ACT nasal spray Place 2 sprays into both nostrils daily as needed. ALLERGIES    . furosemide (LASIX) 40 MG tablet Take 40 mg by mouth daily as needed (swelling).     Marland Kitchen losartan (COZAAR) 50 MG tablet Take 1 tablet by mouth daily.    . potassium chloride SA (K-DUR,KLOR-CON) 20 MEQ tablet Take 1 tablet by mouth daily.    . RABEprazole (ACIPHEX) 20 MG tablet Take 20 mg by mouth daily.     . Rivaroxaban (XARELTO) 15 MG TABS tablet Take 15 mg daily by mouth.     No current facility-administered medications for this visit.      Past Medical History:  Diagnosis Date  . ATRIAL FIBRILLATION 06/26/2008   Qualifier: Diagnosis of  By: Ladona Ridgel, MD, Jerrell Mylar   . BRADYCARDIA  06/06/2008   Qualifier: Diagnosis of  By: Flonnie Overman    . CARDIOMYOPATHY, SECONDARY 06/06/2008   Qualifier: Diagnosis of  By: Flonnie Overman    . Essential hypertension 06/06/2008   Qualifier: Diagnosis of  By: Flonnie Overman    . SINUSITIS 06/06/2008   Qualifier: Diagnosis of  By: Flonnie Overman    . 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   Past Surgical History:  Procedure Laterality Date  . DC cardioversion       /FHX - no premature CAD, pos for HTN  Social History   Socioeconomic History  . Marital status: Married    Spouse name: Not on file  . Number of children: Not on file  . Years of education: Not on file  . Highest education level: Not on file  Social Needs  . Financial resource strain: Not on file  . Food insecurity - worry: Not on file  . Food insecurity - inability: Not on file  . Transportation needs - medical: Not on file  . Transportation needs - non-medical: Not on  file  Occupational History  . Not on file  Tobacco Use  . Smoking status: Former Smoker    Packs/day: 0.50    Years: 10.00    Pack years: 5.00    Types: Cigarettes    Last attempt to quit: 01/14/1980    Years since quitting: 37.0  . Smokeless tobacco: Never Used  . Tobacco comment: stopped smoking 20 years ago  Substance and Sexual Activity  . Alcohol use: No  . Drug use: No  . Sexual activity: Not on file  Other Topics Concern  . Not on file  Social History Narrative  . Not on file     Ht 5\' 11"  (1.803 m)   Wt 216 lb (98 kg)   BMI 30.13 kg/m   Physical Exam:  Well appearing middle aged man, NAD HEENT: Unremarkable Neck:  6 cm JVD, no thyromegally Lymphatics:  No adenopathy Back:  No CVA tenderness Lungs:  Clear with no wheezes HEART:  IRegular rate rhythm, no murmurs, no rubs, no clicks Abd:  soft, positive bowel  sounds, no organomegally, no rebound, no guarding Ext:  2 plus pulses, no edema, no cyanosis, no clubbing Skin:  No rashes no nodules Neuro:  CN II through XII intact, motor grossly intact  EKG - atrial fib with a slow VR   Assess/Plan: 1. Atrial fib - his ventricular rate is slow. I have recommended he undergo PPM insertion and I have discussed the indications/risks/benefits/goals/expectations of PPM insertion and he wishes to proceed. I have asked that he avoid driving until his PPM is inserted. 2. HTN - we discussed trying to avoid PPM insertion by stopping his Coreg. 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 He also has had very refractory HTN and needs coreg. 3. Diastolic heart failure - he has a h/o severe LV dysfunction which normalized with control of his ventricular rates. He will maintain a low sodium diet. He will continue his current meds. I spent over 25 minutes including over 50% face to face time with the patient. [name] [name],M.D.

## 2017-01-19 ENCOUNTER — Telehealth: Payer: Self-pay

## 2017-01-19 NOTE — Telephone Encounter (Signed)
Spoke with Pt wife, asked if procedure could be moved to 1330 slot.  Wife states ok.  Confirmed last dose Xarelto today.  No further questions.

## 2017-01-22 ENCOUNTER — Other Ambulatory Visit: Payer: Self-pay

## 2017-01-22 ENCOUNTER — Ambulatory Visit (HOSPITAL_COMMUNITY)
Admission: RE | Admit: 2017-01-22 | Discharge: 2017-01-23 | Disposition: A | Discharge status: Home / Self Care | Payer: Medicare Other | Source: Ambulatory Visit | Attending: Internal Medicine | Admitting: Internal Medicine

## 2017-01-22 ENCOUNTER — Encounter (HOSPITAL_COMMUNITY): Payer: Self-pay | Admitting: General Practice

## 2017-01-22 ENCOUNTER — Ambulatory Visit (HOSPITAL_COMMUNITY)
Admission: RE | Disposition: A | Discharge status: Home / Self Care | Payer: Self-pay | Source: Ambulatory Visit | Attending: Internal Medicine

## 2017-01-22 DIAGNOSIS — I110 Hypertensive heart disease with heart failure: Secondary | ICD-10-CM | POA: Insufficient documentation

## 2017-01-22 DIAGNOSIS — Z87891 Personal history of nicotine dependence: Secondary | ICD-10-CM | POA: Insufficient documentation

## 2017-01-22 DIAGNOSIS — Z8249 Family history of ischemic heart disease and other diseases of the circulatory system: Secondary | ICD-10-CM | POA: Diagnosis not present

## 2017-01-22 DIAGNOSIS — Z7951 Long term (current) use of inhaled steroids: Secondary | ICD-10-CM | POA: Diagnosis not present

## 2017-01-22 DIAGNOSIS — I481 Persistent atrial fibrillation: Secondary | ICD-10-CM | POA: Diagnosis not present

## 2017-01-22 DIAGNOSIS — I4891 Unspecified atrial fibrillation: Secondary | ICD-10-CM | POA: Diagnosis not present

## 2017-01-22 DIAGNOSIS — Z950 Presence of cardiac pacemaker: Secondary | ICD-10-CM

## 2017-01-22 DIAGNOSIS — I456 Pre-excitation syndrome: Secondary | ICD-10-CM | POA: Diagnosis not present

## 2017-01-22 DIAGNOSIS — Z7901 Long term (current) use of anticoagulants: Secondary | ICD-10-CM | POA: Insufficient documentation

## 2017-01-22 DIAGNOSIS — Z79899 Other long term (current) drug therapy: Secondary | ICD-10-CM | POA: Diagnosis not present

## 2017-01-22 DIAGNOSIS — I428 Other cardiomyopathies: Secondary | ICD-10-CM | POA: Diagnosis not present

## 2017-01-22 DIAGNOSIS — I442 Atrioventricular block, complete: Secondary | ICD-10-CM | POA: Diagnosis not present

## 2017-01-22 DIAGNOSIS — I5032 Chronic diastolic (congestive) heart failure: Secondary | ICD-10-CM | POA: Diagnosis not present

## 2017-01-22 HISTORY — DX: Pure hypercholesterolemia, unspecified: E78.00

## 2017-01-22 HISTORY — DX: Nausea with vomiting, unspecified: Z98.890

## 2017-01-22 HISTORY — PX: INSERT / REPLACE / REMOVE PACEMAKER: SUR710

## 2017-01-22 HISTORY — DX: Nausea with vomiting, unspecified: R11.2

## 2017-01-22 HISTORY — DX: Presence of cardiac pacemaker: Z95.0

## 2017-01-22 HISTORY — DX: Gastro-esophageal reflux disease without esophagitis: K21.9

## 2017-01-22 HISTORY — DX: Cardiac murmur, unspecified: R01.1

## 2017-01-22 HISTORY — PX: PACEMAKER IMPLANT: EP1218

## 2017-01-22 HISTORY — DX: Heart failure, unspecified: I50.9

## 2017-01-22 HISTORY — DX: Migraine, unspecified, not intractable, without status migrainosus: G43.909

## 2017-01-22 HISTORY — DX: Unspecified osteoarthritis, unspecified site: M19.90

## 2017-01-22 SURGERY — PACEMAKER IMPLANT

## 2017-01-22 MED ORDER — LOSARTAN POTASSIUM 50 MG PO TABS
50.0000 mg | ORAL_TABLET | Freq: Every day | ORAL | Status: DC
  Administered 2017-01-23: 50 mg via ORAL
  Filled 2017-01-22: qty 1

## 2017-01-22 MED ORDER — ONDANSETRON HCL 4 MG/2ML IJ SOLN: 4.0000 mg | Freq: Four times a day (QID) | INTRAMUSCULAR | Status: DC | PRN

## 2017-01-22 MED ORDER — SODIUM CHLORIDE 0.9 % IR SOLN
80.0000 mg | Status: AC
  Administered 2017-01-22: 80 mg

## 2017-01-22 MED ORDER — MUPIROCIN 2 % EX OINT
TOPICAL_OINTMENT | CUTANEOUS | Status: AC
  Filled 2017-01-22: qty 22

## 2017-01-22 MED ORDER — CEFAZOLIN SODIUM-DEXTROSE 2-4 GM/100ML-% IV SOLN
INTRAVENOUS | Status: AC
  Filled 2017-01-22: qty 100

## 2017-01-22 MED ORDER — SODIUM CHLORIDE 0.9 % IV SOLN
INTRAVENOUS | Status: DC
  Administered 2017-01-22: 12:00:00 via INTRAVENOUS

## 2017-01-22 MED ORDER — LIDOCAINE HCL (PF) 1 % IJ SOLN
INTRAMUSCULAR | Status: DC | PRN
  Administered 2017-01-22: 60 mL

## 2017-01-22 MED ORDER — HEPARIN (PORCINE) IN NACL 2-0.9 UNIT/ML-% IJ SOLN
INTRAMUSCULAR | Status: AC
  Filled 2017-01-22: qty 500

## 2017-01-22 MED ORDER — CHLORHEXIDINE GLUCONATE 4 % EX LIQD: 60.0000 mL | Freq: Once | CUTANEOUS | Status: DC

## 2017-01-22 MED ORDER — HEPARIN (PORCINE) IN NACL 2-0.9 UNIT/ML-% IJ SOLN
INTRAMUSCULAR | Status: AC | PRN
  Administered 2017-01-22: 500 mL

## 2017-01-22 MED ORDER — CEFAZOLIN SODIUM-DEXTROSE 2-4 GM/100ML-% IV SOLN
2.0000 g | INTRAVENOUS | Status: AC
  Administered 2017-01-22: 2 g via INTRAVENOUS

## 2017-01-22 MED ORDER — ATORVASTATIN CALCIUM 10 MG PO TABS
10.0000 mg | ORAL_TABLET | Freq: Every day | ORAL | Status: DC
  Administered 2017-01-23: 10 mg via ORAL
  Filled 2017-01-22: qty 1

## 2017-01-22 MED ORDER — CARVEDILOL PHOSPHATE ER 40 MG PO CP24
40.0000 mg | ORAL_CAPSULE | Freq: Every day | ORAL | Status: DC
  Administered 2017-01-23: 40 mg via ORAL
  Filled 2017-01-22: qty 1

## 2017-01-22 MED ORDER — FENTANYL CITRATE (PF) 100 MCG/2ML IJ SOLN
INTRAMUSCULAR | Status: DC | PRN
  Administered 2017-01-22 (×3): 12.5 ug via INTRAVENOUS
  Administered 2017-01-22: 25 ug via INTRAVENOUS

## 2017-01-22 MED ORDER — FUROSEMIDE 40 MG PO TABS: 40.0000 mg | ORAL_TABLET | Freq: Every day | ORAL | Status: DC | PRN

## 2017-01-22 MED ORDER — HYDRALAZINE HCL 20 MG/ML IJ SOLN
10.0000 mg | Freq: Once | INTRAMUSCULAR | Status: AC
  Administered 2017-01-22: 10 mg via INTRAVENOUS

## 2017-01-22 MED ORDER — MIDAZOLAM HCL 5 MG/5ML IJ SOLN
INTRAMUSCULAR | Status: DC | PRN
  Administered 2017-01-22: 2 mg via INTRAVENOUS
  Administered 2017-01-22 (×3): 1 mg via INTRAVENOUS

## 2017-01-22 MED ORDER — ACETAMINOPHEN 325 MG PO TABS: 325.0000 mg | ORAL_TABLET | ORAL | Status: DC | PRN

## 2017-01-22 MED ORDER — POTASSIUM CHLORIDE CRYS ER 20 MEQ PO TBCR
20.0000 meq | EXTENDED_RELEASE_TABLET | Freq: Every day | ORAL | Status: DC
  Administered 2017-01-23: 20 meq via ORAL
  Filled 2017-01-22: qty 1

## 2017-01-22 MED ORDER — FENTANYL CITRATE (PF) 100 MCG/2ML IJ SOLN
INTRAMUSCULAR | Status: AC
  Filled 2017-01-22: qty 2

## 2017-01-22 MED ORDER — SODIUM CHLORIDE 0.9 % IR SOLN
Status: AC
  Filled 2017-01-22: qty 2

## 2017-01-22 MED ORDER — MIDAZOLAM HCL 5 MG/5ML IJ SOLN
INTRAMUSCULAR | Status: AC
  Filled 2017-01-22: qty 5

## 2017-01-22 MED ORDER — HYDRALAZINE HCL 20 MG/ML IJ SOLN
INTRAMUSCULAR | Status: AC
  Filled 2017-01-22: qty 1

## 2017-01-22 MED ORDER — LIDOCAINE HCL (PF) 1 % IJ SOLN
INTRAMUSCULAR | Status: AC
  Filled 2017-01-22: qty 60

## 2017-01-22 MED ORDER — CARVEDILOL 25 MG PO TABS
25.0000 mg | ORAL_TABLET | Freq: Once | ORAL | Status: AC
  Administered 2017-01-22: 25 mg via ORAL
  Filled 2017-01-22: qty 1

## 2017-01-22 SURGICAL SUPPLY — 14 items
CABLE SURGICAL S-101-97-12 (CABLE) ×1 IMPLANT
CATH RIGHTSITE C315HIS02 (CATHETERS) ×1 IMPLANT
GUIDEWIRE ANGLED .035X150CM (WIRE) ×1 IMPLANT
IPG PACE AZUR XT DR MRI W1DR01 (Pacemaker) IMPLANT
LEAD CAPSURE NOVUS 5076-58CM (Lead) ×1 IMPLANT
LEAD SELECT SECURE 3830 383069 (Lead) IMPLANT
PACE AZURE XT DR MRI W1DR01 (Pacemaker) ×2 IMPLANT
PAD DEFIB LIFELINK (PAD) ×1 IMPLANT
SELECT SECURE 3830 383069 (Lead) ×2 IMPLANT
SHEATH CLASSIC 7F (SHEATH) ×2 IMPLANT
SHEATH CLASSIC 7F 25CM (SHEATH) ×1 IMPLANT
SLITTER 6232ADJ (MISCELLANEOUS) ×1 IMPLANT
TRAY PACEMAKER INSERTION (PACKS) ×1 IMPLANT
WIRE HI TORQ VERSACORE-J 145CM (WIRE) ×1 IMPLANT

## 2017-01-22 NOTE — Progress Notes (Signed)
Dr. Ladona Ridgel paged and made aware of high BP. Patient states he took his morning medication. Orders received

## 2017-01-22 NOTE — Interval H&P Note (Signed)
History and Physical Interval Note:  01/22/2017 12:16 PM  [name] [name]  has presented today for surgery, with the diagnosis of bradycardia, intermittent heart block  The various methods of treatment have been discussed with the patient and family. After consideration of risks, benefits and other options for treatment, the patient has consented to  Procedure(s): PACEMAKER IMPLANT (N/A) as a surgical intervention .  The patient's history has been reviewed, patient examined, no change in status, stable for surgery.  I have reviewed the patient's chart and labs.  Questions were answered to the patient's satisfaction.     Lewayne Bunting

## 2017-01-22 NOTE — Discharge Summary (Signed)
ELECTROPHYSIOLOGY PROCEDURE DISCHARGE SUMMARY    Patient ID: [name] [name],  MRN: [id number], DOB/AGE: [birth year] 67 y.o.  Admit date: 01/22/2017 Discharge date: 01/23/17  Primary Care Physician: Donata Duff, DO  Primary Cardiologist/Electrophysiologist: Dr. Ladona Ridgel  Primary Discharge Diagnosis:  1. Symptomatic bradycardia status post pacemaker implantation this admission  Secondary Discharge Diagnosis:  1. Persistent AFib     CHA2DS2Vasc is 2, on Xarelto, current dose 2/2 hx of recurrent LGIB historically (none with current) 2. HTN 3. WPW (hx of failed ablations)  Allergies  Allergen Reactions  . Aspirin Other (See Comments)    Unknown  . Cardizem [Diltiazem]     Unknown  . Digoxin And Related Other (See Comments)    unknown  . Diltiazem Hcl     Generalized Edema (intolerance)   . Metoprolol Other (See Comments)    Fluid retention  . Ouabain     Generalized Edema (intolerance)   . Pantoprazole Sodium Other (See Comments)    Stomach pain  . Propafenone Hcl Er Other (See Comments)    H/A, N/V, stomach upset     Procedures This Admission:  1.  Implantation of a MDT dual chamber PPM on 01/22/17 by Dr Ladona Ridgel.  The patient received a [employer] (serial number [id number]) right atrial/His bundle lead and a [employer] (serial number [id number]) right ventricular lead,  [employer] (serial number [id number]) pacemaker There were no immediate post procedure complications. 2.  CXR on 01/23/17 demonstrated no pneumothorax status post device implantation.   Brief HPI: [name] [name] is a 67 y.o. male is followed out patient found with symptomatic bradycardia and pauses.  Past medical history is noted above.   Risks, benefits, and alternatives to PPM implantation were reviewed with the patient who wished to proceed.   Hospital Course:  The patient was admitted and underwent implantation of a PPM with details as outlined above. He  was monitored on telemetry overnight  which demonstrated AFib, CVR, intermittent V pacing.  Left chest was without hematoma or ecchymosis.  The device was interrogated and found to be functioning normally.  CXR was obtained and demonstrated no pneumothorax status post device implantation.  Wound care, arm mobility, and restrictions were reviewed with the patient.  The patient was examined by Dr. Ladona Ridgel and considered stable for discharge to home.   BP has been elevated, though last few much improved 140's/80, the patient reports a new BP medicine recently Rx by his PMD, he cannot recall the name of, not on our current list, he will call the office with this update.   Physical Exam: Vitals:   01/22/17 1930 01/22/17 1945 01/22/17 2000 01/23/17 0335  BP: (!) 143/85 (!) 151/96 (!) 156/80 (!) 128/101  Pulse: 72 78 87 63  Resp: 14 (!) 9 11 11   Temp:   98.4 F (36.9 C) 98.3 F (36.8 C)  TempSrc:   Oral Oral  SpO2: 100% 100% 100% 99%  Weight:      Height:        GEN- The patient is well appearing, alert and oriented x 3 today.   HEENT: normocephalic, atraumatic; sclera clear, conjunctiva pink; hearing intact; oropharynx clear; neck supple, no JVP Lungs- CTA b/l, normal work of breathing.  No wheezes, rales, rhonchi Heart- RRR, no murmurs, rubs or gallops, PMI not laterally displaced GI- soft, non-tender, non-distended, Extremities- no clubbing, cyanosis, or edema MS- no significant deformity or atrophy Skin- warm and dry, no rash or lesion, left chest without hematoma/ecchymosis Psych- euthymic  mood, full affect Neuro- no gross deficits   Labs:   Lab Results  Component Value Date   WBC 4.6 01/08/2017   HGB 15.2 01/08/2017   HCT 45.3 01/08/2017   MCV 91 01/08/2017   PLT 238 01/08/2017   No results for input(s): NA, K, CL, CO2, BUN, CREATININE, CALCIUM, PROT, BILITOT, ALKPHOS, ALT, AST, GLUCOSE in the last 168 hours.  Invalid input(s): LABALBU  Discharge Medications:  Allergies as of 01/23/2017      Reactions   Aspirin  Other (See Comments)   Unknown   Cardizem [diltiazem]    Unknown   Digoxin And Related Other (See Comments)   unknown   Diltiazem Hcl    Generalized Edema (intolerance)   Metoprolol Other (See Comments)   Fluid retention   Ouabain    Generalized Edema (intolerance)   Pantoprazole Sodium Other (See Comments)   Stomach pain   Propafenone Hcl Er Other (See Comments)   H/A, N/V, stomach upset      Medication List    TAKE these medications   atorvastatin 10 MG tablet Commonly known as:  LIPITOR Take 10 mg by mouth daily.   carvedilol 20 MG 24 hr capsule Commonly known as:  COREG CR Take 2 capsules (40 mg total) by mouth daily.   fluticasone 50 MCG/ACT nasal spray Commonly known as:  FLONASE Place 2 sprays into both nostrils daily as needed. ALLERGIES   furosemide 40 MG tablet Commonly known as:  LASIX Take 40 mg by mouth daily as needed (swelling).   losartan 50 MG tablet Commonly known as:  COZAAR Take 1 tablet by mouth daily.   potassium chloride SA 20 MEQ tablet Commonly known as:  K-DUR,KLOR-CON Take 1 tablet by mouth daily.   RABEprazole 20 MG tablet Commonly known as:  ACIPHEX Take 20 mg by mouth daily.   Rivaroxaban 15 MG Tabs tablet Commonly known as:  XARELTO Take 15 mg daily by mouth. Notes to patient:  Resume on Sunday 01/25/17 evening       Disposition:  Home  Follow-up Information    [facility] Follow up on 02/02/2017.   Specialty:  Cardiology Why:  11:30AM, wound check visit Contact information: [address] [place] [address] [phone]       Marinus Maw, MD Follow up on 04/24/2017.   Specialty:  Cardiology Why:  11:30AM Contact information: [address] [place] [address] [phone]           Duration of Discharge Encounter: Greater than 30 minutes including physician time.  Norma Fredrickson, PA-C 01/23/2017 7:53 AM  EP  Attending  Patient seen and examined. Agree with above. The patient is stable after PPM insertion and his device has been interogated under my direct supervision. Usual followup.   Leonia Reeves.D.

## 2017-01-23 ENCOUNTER — Encounter (HOSPITAL_COMMUNITY): Payer: Self-pay | Admitting: Internal Medicine

## 2017-01-23 ENCOUNTER — Ambulatory Visit (HOSPITAL_COMMUNITY): Payer: Medicare Other

## 2017-01-23 DIAGNOSIS — I4891 Unspecified atrial fibrillation: Secondary | ICD-10-CM | POA: Diagnosis not present

## 2017-01-23 DIAGNOSIS — I110 Hypertensive heart disease with heart failure: Secondary | ICD-10-CM | POA: Diagnosis not present

## 2017-01-23 DIAGNOSIS — I5032 Chronic diastolic (congestive) heart failure: Secondary | ICD-10-CM | POA: Diagnosis not present

## 2017-01-23 DIAGNOSIS — I442 Atrioventricular block, complete: Secondary | ICD-10-CM | POA: Diagnosis not present

## 2017-01-23 MED ORDER — CEFAZOLIN SODIUM-DEXTROSE 1-4 GM/50ML-% IV SOLN
1.0000 g | Freq: Once | INTRAVENOUS | Status: AC
  Administered 2017-01-23: 1 g via INTRAVENOUS
  Filled 2017-01-23 (×2): qty 50

## 2017-01-23 MED FILL — Gentamicin Sulfate Inj 40 MG/ML: INTRAMUSCULAR | Qty: 80 | Status: AC

## 2017-01-23 NOTE — Discharge Instructions (Signed)
° ° °  Supplemental Discharge Instructions for  Pacemaker/Defibrillator Patients  Activity No heavy lifting or vigorous activity with your left/right arm for 6 to 8 weeks.  Do not raise your left/right arm above your head for one week.  Gradually raise your affected arm as drawn below.             01/26/17                     01/27/17                    01/29/16                   01/30/16  __  NO DRIVING for  1 week  ; you may begin driving on  1/61/09   .  WOUND CARE - Keep the wound area clean and dry.  Do not get this area wet for one week. No showers for one week; you may shower on  01/29/17   . - The tape/steri-strips on your wound will fall off; do not pull them off.  No bandage is needed on the site.  DO  NOT apply any creams, oils, or ointments to the wound area. - If you notice any drainage or discharge from the wound, any swelling or bruising at the site, or you develop a fever > 101? F after you are discharged home, call the office at once.  Special Instructions - You are still able to use cellular telephones; use the ear opposite the side where you have your pacemaker/defibrillator.  Avoid carrying your cellular phone near your device. - When traveling through airports, show security personnel your identification card to avoid being screened in the metal detectors.  Ask the security personnel to use the hand wand. - Avoid arc welding equipment, MRI testing (magnetic resonance imaging), TENS units (transcutaneous nerve stimulators).  Call the office for questions about other devices. - Avoid electrical appliances that are in poor condition or are not properly grounded. - Microwave ovens are safe to be near or to operate.  Additional information for defibrillator patients should your device go off: - If your device goes off ONCE and you feel fine afterward, notify the device clinic nurses. - If your device goes off ONCE and you do not feel well afterward, call 911. - If your device  goes off TWICE, call 911. - If your device goes off THREE times in one day, call 911.  DO NOT DRIVE YOURSELF OR A FAMILY MEMBER WITH A DEFIBRILLATOR TO THE HOSPITAL--CALL 911.

## 2017-01-28 ENCOUNTER — Ambulatory Visit: Payer: Medicare Other

## 2017-01-29 ENCOUNTER — Telehealth: Payer: Self-pay | Admitting: Internal Medicine

## 2017-01-29 NOTE — Telephone Encounter (Signed)
Call received from wife with some questions: 1.  Asking if Pt should continue taking losartan.  Advised he should.  Wife asking if his losartan is ok?  Notified to call her pharmacy and see if his losartan was part of recall. 2.  Wife states there is some swelling over pacemaker site, no s/s of infection.  Wife thinks Pt may be doing too much with arm.  Notified slight swelling ok, cool compress to area as needed.  Pt with wound check Monday January 21.   All concerns addressed.  Notified to call if anything additional.

## 2017-02-02 ENCOUNTER — Encounter: Payer: Self-pay | Admitting: Internal Medicine

## 2017-02-02 ENCOUNTER — Ambulatory Visit (INDEPENDENT_AMBULATORY_CARE_PROVIDER_SITE_OTHER): Payer: Medicare Other | Admitting: *Deleted

## 2017-02-02 DIAGNOSIS — I482 Chronic atrial fibrillation, unspecified: Secondary | ICD-10-CM

## 2017-02-02 DIAGNOSIS — I442 Atrioventricular block, complete: Secondary | ICD-10-CM | POA: Diagnosis not present

## 2017-02-02 LAB — CUP PACEART INCLINIC DEVICE CHECK
Battery Remaining Longevity: 163 mo
Brady Statistic AP VP Percent: 30.24 %
Brady Statistic AP VS Percent: 0 %
Brady Statistic RA Percent Paced: 29.22 %
Brady Statistic RV Percent Paced: 30.24 %
Implantable Lead Implant Date: 20190110
Implantable Lead Location: 753860
Implantable Lead Location: 753860
Implantable Lead Model: 3830
Lead Channel Impedance Value: 551 Ohm
Lead Channel Impedance Value: 570 Ohm
Lead Channel Impedance Value: 627 Ohm
Lead Channel Pacing Threshold Pulse Width: 1 ms
Lead Channel Sensing Intrinsic Amplitude: 12.5 mV
Lead Channel Sensing Intrinsic Amplitude: 18.25 mV
Lead Channel Sensing Intrinsic Amplitude: 18.25 mV
Lead Channel Setting Pacing Amplitude: 3.5 V
Lead Channel Setting Pacing Amplitude: 3.5 V
Lead Channel Setting Pacing Pulse Width: 0.4 ms
Lead Channel Setting Sensing Sensitivity: 0.9 mV
MDC IDC LEAD IMPLANT DT: 20190110
MDC IDC MSMT BATTERY VOLTAGE: 3.21 V
MDC IDC MSMT LEADCHNL RA IMPEDANCE VALUE: 418 Ohm
MDC IDC MSMT LEADCHNL RA PACING THRESHOLD AMPLITUDE: 0.5 V
MDC IDC MSMT LEADCHNL RA SENSING INTR AMPL: 12.5 mV
MDC IDC MSMT LEADCHNL RV PACING THRESHOLD AMPLITUDE: 0.5 V
MDC IDC MSMT LEADCHNL RV PACING THRESHOLD PULSEWIDTH: 0.4 ms
MDC IDC PG IMPLANT DT: 20190110
MDC IDC SESS DTM: 20190121124103
MDC IDC STAT BRADY AS VP PERCENT: 0 %
MDC IDC STAT BRADY AS VS PERCENT: 69.76 %

## 2017-02-02 NOTE — Progress Notes (Signed)
Wound check appointment. Steri-strips removed. Wound without redness or edema. Incision edges approximated, wound well healed. Normal device function. Thresholds, sensing, and impedances consistent with implant measurements. 12 lead EKG HIS pacing appears to be septal. Fusion/ safety pacing noted on the ventricular electrogram. 4 VT episodes~  irr v-v intervals.  Device programmed at 3.5V programmed on for extra safety margin until 3 month visit. Histogram distribution appropriate for patient and level of activity. No mode switches or high ventricular rates noted. Patient educated about wound care, arm mobility, lifting restrictions. ROV w/ GT 04/24/2017

## 2017-02-26 ENCOUNTER — Telehealth: Payer: Self-pay | Admitting: Internal Medicine

## 2017-02-26 NOTE — Telephone Encounter (Signed)
New message   Patient states he has not received information about his device. He will be traveling on 03/06/17 and wants information to carry with him  1. Has your device fired? NO  2. Is you device beeping? NO 3. Are you experiencing draining or swelling at device site? NO  4. Are you calling to see if we received your device transmission?NO  5. Have you passed out? NO    Please route to Device Clinic Pool

## 2017-02-26 NOTE — Telephone Encounter (Signed)
Advised [name] [name] that the device ID cards come from the company and that he should have been discharged with a temporary ID card in his paperwork. I gave him the number to call [employer] to see if they could expedite his permanent ID card for his upcoming travels. He verbalizes understanding and is appreciative.

## 2017-04-24 ENCOUNTER — Ambulatory Visit: Payer: Medicare Other | Admitting: Internal Medicine

## 2017-04-24 ENCOUNTER — Encounter: Payer: Self-pay | Admitting: Internal Medicine

## 2017-04-24 VITALS — BP 140/80 | HR 87 | Ht 71.0 in | Wt 220.0 lb

## 2017-04-24 DIAGNOSIS — I482 Chronic atrial fibrillation, unspecified: Secondary | ICD-10-CM

## 2017-04-24 DIAGNOSIS — R42 Dizziness and giddiness: Secondary | ICD-10-CM

## 2017-04-24 DIAGNOSIS — Z950 Presence of cardiac pacemaker: Secondary | ICD-10-CM | POA: Diagnosis not present

## 2017-04-24 DIAGNOSIS — I442 Atrioventricular block, complete: Secondary | ICD-10-CM | POA: Diagnosis not present

## 2017-04-24 DIAGNOSIS — I10 Essential (primary) hypertension: Secondary | ICD-10-CM | POA: Diagnosis not present

## 2017-04-24 NOTE — Patient Instructions (Addendum)
Medication Instructions:  Your physician recommends that you continue on your current medications as directed. Please refer to the Current Medication list given to you today.  Labwork: None ordered.  Testing/Procedures: None ordered.  Follow-Up: Your physician wants you to follow-up in: 1 year with Dr. Ladona Ridgel.   You will receive a reminder letter in the mail two months in advance. If you don't receive a letter, please call our office to schedule the follow-up appointment.  Remote monitoring is used to monitor your Pacemaker from home. This monitoring reduces the number of office visits required to check your device to one time per year. It allows [place] to keep an eye on the functioning of your device to ensure it is working properly. You are scheduled for a device check from home on 07/24/2017. You may send your transmission at any time that day. If you have a wireless device, the transmission will be sent automatically. After your physician reviews your transmission, you will receive a postcard with your next transmission date.  Any Other Special Instructions Will Be Listed Below (If Applicable).  If you need a refill on your cardiac medications before your next appointment, please call your pharmacy.

## 2017-04-24 NOTE — Progress Notes (Addendum)
HPI [name] returns today for followup. He is a pleasant 67 yo man with chronic atrial fib, HTN, chronic diastolic heart failure and symptomatic pauses who underwent PPM insertion over 3 months ago. In the interim he has done well with no chest pain or syncope. He does have dyspnea with exertion although this is a fleeting symptom. He admits to dietary indiscretion. Allergies  Allergen Reactions  . Aspirin Other (See Comments)    Unknown  . Cardizem [Diltiazem]     Unknown  . Digoxin And Related Other (See Comments)    unknown  . Diltiazem Hcl     Generalized Edema (intolerance)   . Metoprolol Other (See Comments)    Fluid retention  . Ouabain     Generalized Edema (intolerance)   . Pantoprazole Sodium Other (See Comments)    Stomach pain  . Propafenone Hcl Er Other (See Comments)    H/A, N/V, stomach upset     Current Outpatient Medications  Medication Sig Dispense Refill  . atorvastatin (LIPITOR) 10 MG tablet Take 10 mg by mouth daily.    . carvedilol (COREG CR) 20 MG 24 hr capsule Take 2 capsules (40 mg total) by mouth daily. 180 capsule 3  . fluticasone (FLONASE) 50 MCG/ACT nasal spray Place 2 sprays into both nostrils daily as needed. ALLERGIES    . furosemide (LASIX) 40 MG tablet Take 40 mg by mouth daily as needed (swelling).     . hydrALAZINE (APRESOLINE) 25 MG tablet Take 25 mg by mouth daily.    Marland Kitchen losartan (COZAAR) 50 MG tablet Take 1 tablet by mouth daily.    . potassium chloride SA (K-DUR,KLOR-CON) 20 MEQ tablet Take 1 tablet by mouth daily.    . RABEprazole (ACIPHEX) 20 MG tablet Take 20 mg by mouth daily.     . Rivaroxaban (XARELTO) 15 MG TABS tablet Take 15 mg daily by mouth.     No current facility-administered medications for this visit.      Past Medical History:  Diagnosis Date  . Arthritis    "all" (01/22/2017)  . ATRIAL FIBRILLATION 06/26/2008   Qualifier: Diagnosis of  By: Ladona Ridgel, MD, Jerrell Mylar   . BRADYCARDIA 06/06/2008   Qualifier: Diagnosis of  By: Flonnie Overman    . CARDIOMYOPATHY, SECONDARY 06/06/2008   Qualifier: Diagnosis of  By: Flonnie Overman    . CHF (congestive heart failure) (HCC)   . Essential hypertension 06/06/2008   Qualifier: Diagnosis of  By: Flonnie Overman    . GERD (gastroesophageal reflux disease)   . Heart murmur   . High cholesterol   . Migraine    "none in the 2000s" (01/22/2017)  . PONV (postoperative nausea and vomiting)   . Presence of permanent cardiac pacemaker   . SINUSITIS 06/06/2008   Qualifier: Diagnosis of  By: Flonnie Overman    . 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   Past Surgical History:  Procedure Laterality Date  . ATRIAL FIBRILLATION ABLATION    . CARDIAC CATHETERIZATION    . CARDIOVERSION  06/19/2005   DC cardioversion Hattie Perch 05/28/2010  . INSERT / REPLACE / REMOVE PACEMAKER  01/22/2017  . PACEMAKER IMPLANT N/A 01/22/2017   Procedure: PACEMAKER IMPLANT;  Surgeon: Marinus Maw, MD;  Location: [facility] INVASIVE CV LAB;  Service: Cardiovascular;  Laterality: N/A;  .  SHOULDER ARTHROSCOPY WITH ROTATOR CUFF REPAIR Left ~ 2015     No family history on file.   Social History   Socioeconomic History  . Marital status: Married    Spouse name: Not on file  . Number of children: Not on file  . Years of education: Not on file  . Highest education level: Not on file  Occupational History  . Not on file  Social Needs  . Financial resource strain: Not on file  . Food insecurity:    Worry: Not on file    Inability: Not on file  . Transportation needs:    Medical: Not on file    Non-medical: Not on file  Tobacco Use  . Smoking status: Former Smoker    Packs/day: 0.33    Years: 7.00    Pack years: 2.31    Types: Cigarettes    Last attempt to quit: 1975    Years since quitting: 44.3  . Smokeless tobacco:  Never Used  Substance and Sexual Activity  . Alcohol use: No  . Drug use: No  . Sexual activity: Not Currently  Lifestyle  . Physical activity:    Days per week: Not on file    Minutes per session: Not on file  . Stress: Not on file  Relationships  . Social connections:    Talks on phone: Not on file    Gets together: Not on file    Attends religious service: Not on file    Active member of club or organization: Not on file    Attends meetings of clubs or organizations: Not on file    Relationship status: Not on file  . Intimate partner violence:    Fear of current or ex partner: Not on file    Emotionally abused: Not on file    Physically abused: Not on file    Forced sexual activity: Not on file  Other Topics Concern  . Not on file  Social History Narrative  . Not on file     BP 140/80   Pulse 87   Ht 5\' 11"  (1.803 m)   Wt 220 lb (99.8 kg)   BMI 30.68 kg/m   Physical Exam:  Well appearing 67 yo man, NAD HEENT: Unremarkable Neck:  6 cm JVD, no thyromegally Lymphatics:  No adenopathy Back:  No CVA tenderness Lungs:  Clear with no wheezes HEART: I Regular rate rhythm, no murmurs, no rubs, no clicks Abd:  soft, positive bowel sounds, no organomegally, no rebound, no guarding Ext:  2 plus pulses, no edema, no cyanosis, no clubbing Skin:  No rashes no nodules Neuro:  CN II through XII intact, motor grossly intact  EKG - atrial fib with a controlled VR and occaisional RV pacing  DEVICE  Normal device function.  See PaceArt for details.   Assess/Plan: 1. Transient AV block - he is asymptomatic s/p PPM insertion 2. Atrial fib - his rates are mostly well controlled. We will follow. No change in meds. 3. PPM - his PPM interogation demonstrates normal DDD PM function. His atrial port has his His bundle lead in place and will program his initial AV delay to be increased to avoid unnecessary ventricular pacing 4. Dyspnea with exertion - he has evidence of diastolic  CHF. I have asked the patient to reduce his sodium intake. Will follow.  [name] [name].D.

## 2017-04-28 NOTE — Addendum Note (Signed)
Addended by: Micki Riley C on: 04/28/2017 08:22 AM   Modules accepted: Orders

## 2017-05-06 ENCOUNTER — Telehealth: Payer: Self-pay | Admitting: *Deleted

## 2017-05-06 NOTE — Telephone Encounter (Signed)
   [facility] Pre-operative Risk Assessment    Request for surgical clearance:  1. What type of surgery is being performed? Endoscopy and/or colonoscopy   2. When is this surgery scheduled? TBD   3. What type of clearance is required (medical clearance vs. Pharmacy clearance to hold med vs. Both)? pharmacy  4. Are there any medications that need to be held prior to surgery and how long?--Xarelto-2 doses prior to procedure  5. Practice name and name of physician performing surgery? [facility].     6. What is your office phone number [phone]    7.   What is your office fax [phone]  8.   Anesthesia type (None, local, MAC, general) ? Not noted.     _________________________________________________________________   (provider comments below)

## 2017-05-07 NOTE — Telephone Encounter (Signed)
Pt takes Xarelto for afib with CHADS2VASc score of 3 (age, HTN, CHF). CrCl is 94mL/min. Ok to hold 2 doses of Xarelto prior to procedure as requested.  Will also route to Dr Ladona Ridgel - Xarelto is listed under historic meds and we have not been prescribing, but pt qualifies for 20mg  dosing rather than 15mg  due to normal renal function.

## 2017-05-08 NOTE — Telephone Encounter (Signed)
   Primary Cardiologist: No primary care provider on file.  Chart reviewed as part of pre-operative protocol coverage. Patient was contacted 05/08/2017 in reference to pre-operative risk assessment for pending surgery as outlined below.  [name] [name] was last seen on 04/24/2017 by Dr. Ladona [name].  Since that day, [name] [name] has done well.  Therefore, based on ACC/AHA guidelines, the patient would be at acceptable risk for the planned procedure without further cardiovascular testing.   I will route this recommendation to the requesting party via Epic fax function and remove from pre-op pool.  Please call with questions. Patient has been informed to hold Xarelto for 2 doses prior to colonoscopy +/- endoscopy and restart afterward as soon as possible at the discretion of GI physician.   Dr. Ladona [name] to review the strength of his Xarelto and potentially change him to 20mg  daily instead of the 15mg  daily dose  [name] [name], [place] 05/08/2017, 2:32 PM

## 2017-06-05 NOTE — Telephone Encounter (Signed)
Per Dr. Ladona Ridgel - continue 15mg  Xarelto dose due to severe nose bleeds and previous GI bleed on higher dose.

## 2017-06-18 ENCOUNTER — Telehealth: Payer: Self-pay | Admitting: Internal Medicine

## 2017-06-18 NOTE — Telephone Encounter (Signed)
LMTCB//sss 

## 2017-06-18 NOTE — Telephone Encounter (Signed)
Spoke with patient about his symptoms. Patient has been out of town and eating out a lot. Patient has a device. Patient does not think his tightness in chest/abd area is related to his heart. Will send message to device clinic to see if they can tell anything from his device.

## 2017-06-18 NOTE — Telephone Encounter (Signed)
Patient returned my call. Instructed patient on how to send remote transmission. I told patient that we would call back once it's received.

## 2017-06-18 NOTE — Telephone Encounter (Signed)
New message:     Pt c/o BP issue: STAT if pt c/o blurred vision, one-sided weakness or slurred speech  1. What are your last 5 BP readings? 168/119 Pulse 78: just now 153/105 pulse 79: 10 minutes   2. Are you having any other symptoms (ex. Dizziness, headache, blurred vision, passed out)? No  3. What is your BP issue? BP is very high and pt feels very sluggish and tightness but not pertaining to his heart

## 2017-06-18 NOTE — Telephone Encounter (Signed)
Patient returned my call. I asked patient to send in a remote transmission. Patient states that he's not home, but plans to call for assistance once he returns.

## 2017-06-19 NOTE — Telephone Encounter (Signed)
Pt provided monitor serial number and transmission received.

## 2017-07-24 ENCOUNTER — Ambulatory Visit (INDEPENDENT_AMBULATORY_CARE_PROVIDER_SITE_OTHER): Payer: Medicare Other | Admitting: *Deleted

## 2017-07-24 DIAGNOSIS — I480 Paroxysmal atrial fibrillation: Secondary | ICD-10-CM

## 2017-07-24 NOTE — Progress Notes (Signed)
Remote pacemaker transmission.   

## 2017-07-29 ENCOUNTER — Encounter: Payer: Self-pay | Admitting: Cardiology

## 2017-08-25 LAB — CUP PACEART REMOTE DEVICE CHECK
Battery Remaining Longevity: 168 mo
Brady Statistic RA Percent Paced: 27.25 %
Brady Statistic RV Percent Paced: 0.02 %
Date Time Interrogation Session: 20190712112401
Implantable Lead Model: 3830
Implantable Lead Model: 5076
Implantable Pulse Generator Implant Date: 20190110
Lead Channel Impedance Value: 361 Ohm
Lead Channel Sensing Intrinsic Amplitude: 10.625 mV
Lead Channel Setting Pacing Pulse Width: 0.4 ms
MDC IDC LEAD IMPLANT DT: 20190110
MDC IDC LEAD IMPLANT DT: 20190110
MDC IDC LEAD LOCATION: 753860
MDC IDC LEAD LOCATION: 753860
MDC IDC MSMT BATTERY VOLTAGE: 3.15 V
MDC IDC MSMT LEADCHNL RA IMPEDANCE VALUE: 513 Ohm
MDC IDC MSMT LEADCHNL RA SENSING INTR AMPL: 10.625 mV
MDC IDC MSMT LEADCHNL RV IMPEDANCE VALUE: 513 Ohm
MDC IDC MSMT LEADCHNL RV IMPEDANCE VALUE: 589 Ohm
MDC IDC MSMT LEADCHNL RV SENSING INTR AMPL: 16.75 mV
MDC IDC MSMT LEADCHNL RV SENSING INTR AMPL: 16.75 mV
MDC IDC SET LEADCHNL RA PACING AMPLITUDE: 2 V
MDC IDC SET LEADCHNL RV PACING AMPLITUDE: 2 V
MDC IDC SET LEADCHNL RV SENSING SENSITIVITY: 0.9 mV
MDC IDC STAT BRADY AP VP PERCENT: 0.02 %
MDC IDC STAT BRADY AP VS PERCENT: 27.89 %
MDC IDC STAT BRADY AS VP PERCENT: 0 %
MDC IDC STAT BRADY AS VS PERCENT: 72.09 %

## 2017-09-15 ENCOUNTER — Telehealth: Payer: Self-pay

## 2017-09-15 NOTE — Telephone Encounter (Signed)
    [facility] Pre-operative Risk Assessment    Request for surgical clearance:  1. What type of surgery is being performed?  Endoscopy/Colonoscopy   2. When is this surgery scheduled?  10/02/17   3. What type of clearance is required (medical clearance vs. Pharmacy clearance to hold med vs. Both)?  Both  4. Are there any medications that need to be held prior to surgery and how long? Xarelto 2 days   5. Practice name and name of physician performing surgery?  [facility]   6. What is your office phone number [phone]    7.   What is your office fax number [phone]  8.   Anesthesia type (None, local, MAC, general) ?     Legrand Como  Violet Cart 09/15/2017, 4:43 PM  _________________________________________________________________   (provider comments below)

## 2017-09-16 NOTE — Telephone Encounter (Signed)
   Primary Cardiologist: Lewayne Bunting, MD  Chart reviewed as part of pre-operative protocol coverage.  Left voice mail to call between pre-op hours.   [place], [place] 09/16/2017, 2:40 PM

## 2017-09-18 NOTE — Telephone Encounter (Signed)
   Primary Cardiologist: Lewayne Bunting, MD  Chart reviewed as part of pre-operative protocol coverage. Patient was contacted 09/18/2017 in reference to pre-operative risk assessment for pending surgery as outlined below.  Mills Mckaig Garson was last seen on 04/24/17 by Dr. Ladona Ridgel.  Since that day, [name] [name] has done well. Getting > 4 mets of activity.   Therefore, based on ACC/AHA guidelines, the patient would be at acceptable risk for the planned procedure without further cardiovascular testing.   Will ask pharmacy to review anticoagulation.   [place], [place] 09/18/2017, 9:45 AM

## 2017-09-18 NOTE — Telephone Encounter (Signed)
Patient with diagnosis of atrial fibrillation on Xarelto for anticoagulation.    Procedure: colonoscopy/endoscopy Date of procedure: 10/02/17  CHADS2-VASc score of  2 (, HTN, AGE, )  CrCl 80.7 Platelet count 238  Per office protocol, patient can hold Xarelto for 1 day prior to procedure.    Patient should restart Xarelto on the evening of procedure or day after, at discretion of procedure MD

## 2017-10-23 ENCOUNTER — Ambulatory Visit (INDEPENDENT_AMBULATORY_CARE_PROVIDER_SITE_OTHER): Payer: Medicare Other | Admitting: *Deleted

## 2017-10-23 DIAGNOSIS — R001 Bradycardia, unspecified: Secondary | ICD-10-CM | POA: Diagnosis not present

## 2017-10-23 DIAGNOSIS — I442 Atrioventricular block, complete: Secondary | ICD-10-CM

## 2017-10-23 NOTE — Progress Notes (Signed)
Remote pacemaker transmission.   

## 2017-10-29 ENCOUNTER — Encounter: Payer: Self-pay | Admitting: Cardiology

## 2017-12-01 LAB — CUP PACEART REMOTE DEVICE CHECK
Battery Voltage: 3.12 V
Brady Statistic AP VP Percent: 0.02 %
Brady Statistic RA Percent Paced: 29.16 %
Implantable Lead Implant Date: 20190110
Implantable Lead Location: 753860
Implantable Pulse Generator Implant Date: 20190110
Lead Channel Impedance Value: 513 Ohm
Lead Channel Pacing Threshold Pulse Width: 0.4 ms
Lead Channel Sensing Intrinsic Amplitude: 10.25 mV
Lead Channel Sensing Intrinsic Amplitude: 10.25 mV
Lead Channel Setting Pacing Pulse Width: 0.4 ms
MDC IDC LEAD IMPLANT DT: 20190110
MDC IDC LEAD LOCATION: 753860
MDC IDC MSMT BATTERY REMAINING LONGEVITY: 166 mo
MDC IDC MSMT LEADCHNL RA IMPEDANCE VALUE: 361 Ohm
MDC IDC MSMT LEADCHNL RA IMPEDANCE VALUE: 513 Ohm
MDC IDC MSMT LEADCHNL RV IMPEDANCE VALUE: 589 Ohm
MDC IDC MSMT LEADCHNL RV PACING THRESHOLD AMPLITUDE: 0.875 V
MDC IDC MSMT LEADCHNL RV SENSING INTR AMPL: 17 mV
MDC IDC MSMT LEADCHNL RV SENSING INTR AMPL: 17 mV
MDC IDC SESS DTM: 20191011052841
MDC IDC SET LEADCHNL RA PACING AMPLITUDE: 2 V
MDC IDC SET LEADCHNL RV PACING AMPLITUDE: 2 V
MDC IDC SET LEADCHNL RV SENSING SENSITIVITY: 0.9 mV
MDC IDC STAT BRADY AP VS PERCENT: 29.81 %
MDC IDC STAT BRADY AS VP PERCENT: 0 %
MDC IDC STAT BRADY AS VS PERCENT: 70.17 %
MDC IDC STAT BRADY RV PERCENT PACED: 0.02 %

## 2018-01-22 ENCOUNTER — Ambulatory Visit (INDEPENDENT_AMBULATORY_CARE_PROVIDER_SITE_OTHER): Payer: Medicare Other

## 2018-01-22 DIAGNOSIS — R001 Bradycardia, unspecified: Secondary | ICD-10-CM

## 2018-01-22 DIAGNOSIS — I495 Sick sinus syndrome: Secondary | ICD-10-CM | POA: Diagnosis not present

## 2018-01-23 LAB — CUP PACEART REMOTE DEVICE CHECK
Battery Remaining Longevity: 163 mo
Battery Voltage: 3.08 V
Brady Statistic AP VS Percent: 27.95 %
Brady Statistic AS VP Percent: 0 %
Brady Statistic RA Percent Paced: 27.32 %
Implantable Lead Implant Date: 20190110
Implantable Lead Location: 753860
Implantable Lead Model: 3830
Implantable Lead Model: 5076
Implantable Pulse Generator Implant Date: 20190110
Lead Channel Impedance Value: 342 Ohm
Lead Channel Impedance Value: 513 Ohm
Lead Channel Impedance Value: 532 Ohm
Lead Channel Pacing Threshold Pulse Width: 0.4 ms
Lead Channel Sensing Intrinsic Amplitude: 14.5 mV
Lead Channel Sensing Intrinsic Amplitude: 9.5 mV
Lead Channel Sensing Intrinsic Amplitude: 9.5 mV
Lead Channel Setting Pacing Amplitude: 2 V
MDC IDC LEAD IMPLANT DT: 20190110
MDC IDC LEAD LOCATION: 753860
MDC IDC MSMT LEADCHNL RV IMPEDANCE VALUE: 475 Ohm
MDC IDC MSMT LEADCHNL RV PACING THRESHOLD AMPLITUDE: 0.875 V
MDC IDC MSMT LEADCHNL RV SENSING INTR AMPL: 14.5 mV
MDC IDC SESS DTM: 20200110053342
MDC IDC SET LEADCHNL RA PACING AMPLITUDE: 2 V
MDC IDC SET LEADCHNL RV PACING PULSEWIDTH: 0.4 ms
MDC IDC SET LEADCHNL RV SENSING SENSITIVITY: 0.9 mV
MDC IDC STAT BRADY AP VP PERCENT: 0.02 %
MDC IDC STAT BRADY AS VS PERCENT: 72.02 %
MDC IDC STAT BRADY RV PERCENT PACED: 0.02 %

## 2018-01-25 NOTE — Progress Notes (Signed)
Remote pacemaker transmission.   

## 2018-01-27 ENCOUNTER — Encounter: Payer: Self-pay | Admitting: Cardiology

## 2018-02-24 NOTE — Progress Notes (Signed)
Cardiology Office Note    Date:  02/25/2018   ID:  [name] Leberrthur L Denise, DOB 01/04/1951, MRN [id number]  PCP:  Donata DuffFarrah, Victor B, DO  Cardiologist/Electrophysiologist: Dr. Ladona Ridgelaylor  Chief Complaint:  Hospital follow up  History of Present Illness:   [name] [name] is a 68 y.o. male with hx of permanent atrial fibrillation, transient AV block s/p PPM, chronic diastolic heart failure and HLD presents for hospital follow up.   Low risk myoview in 2015. Echo 07/2016 showed LVEF of 50-55%, no wm abnormality and mild MR.   He was doing well when last seen by Dr. Ladona Ridgelaylor 04/2017.  He was taking Xarelto 15 mg daily.  Patient was admitted 02/14/18-02/16/18 [facility] for chest pain, ruled in for non-STEMI.  EKG showed V pacing with no lateral T wave inversion.  High-sensitivity troponin peaked to 1255.  Echocardiogram showed LV function of 57% with normal wall motion.  Cath showed 50 to 75% ostial left circumflex stenosis with likely culprit lesion.  No PCI done.  The dominant left circumflex has an unusual takeoff in ramus distribution and then trifurcates into 3 vessels.  The first vessel is OM1 that supplies the ramus and the diagonal with 50% stenosis.  Next vessel is a large OM 2 which supplies plans to a PDA.  LAD has a 25% ostial and mid stenosis.  dLAD has 75% stenosis.  Mid RCA 75% stomal stenosis.  Recommended medical therapy with addition of nitrates and Plavix.  Continue beta-blocker and aspirin.  If recurrent symptoms plan to consider high risk PCI versus CABG.  Here today for follow up.  He denies recurrent chest discomfort, dyspnea, palpitation, dizziness, orthopnea, PND, syncope, lower extremity edema or melena.  Compliant with medication.  He has prior history of myalgia on Lipitor.  Currently tolerating Crestor 20 mg daily.  This is managed by PCP.  Patient had acute GI bleed January 2020.  Patient was seen by PCP and eventually by GI.  Colonoscopy showed  diverticulitis.  He is off his Xarelto since then.  He has a repeat evaluation by PCP in upcoming weeks.  Past Medical History:  Diagnosis Date  . Arthritis    "all" (01/22/2017)  . ATRIAL FIBRILLATION 06/26/2008   Qualifier: Diagnosis of  By: Ladona Ridgelaylor, MD, Jerrell MylarFACC, Gregg William   . BRADYCARDIA 06/06/2008   Qualifier: Diagnosis of  By: Flonnie OvermanMcLean, Monica    . CAD (coronary artery disease), native coronary artery    a. Cath 02/2018 @ [facility] - 50-70% ostial Lcx; 50% diagonal; 25% ostial LAD; 25% mid LAD; 75% dLAD; 75% dRCA >>> medical therapy  . CARDIOMYOPATHY, SECONDARY 06/06/2008   Qualifier: Diagnosis of  By: Flonnie OvermanMcLean, Monica    . chronic diastolic CHF   . Essential hypertension 06/06/2008   Qualifier: Diagnosis of  By: Flonnie OvermanMcLean, Monica    . GERD (gastroesophageal reflux disease)   . Heart murmur   . High cholesterol   . Migraine    "none in the 2000s" (01/22/2017)  . PONV (postoperative nausea and vomiting)   . Presence of permanent cardiac pacemaker   . SINUSITIS 06/06/2008   Qualifier: Diagnosis of  By: Flonnie OvermanMcLean, Monica    . 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 ATRIAL FIBRILLATION ABLATION    . CARDIAC CATHETERIZATION    . CARDIOVERSION  06/19/2005   DC cardioversion Hattie Perch/notes  05/28/2010  . INSERT / REPLACE / REMOVE PACEMAKER  01/22/2017  . PACEMAKER IMPLANT N/A 01/22/2017   Procedure: PACEMAKER IMPLANT;  Surgeon: Marinus Maw, MD;  Location: [facility] INVASIVE CV LAB;  Service: Cardiovascular;  Laterality: N/A;  . SHOULDER ARTHROSCOPY WITH ROTATOR CUFF REPAIR Left ~ 2015    Current Medications:  Prior to Admission medications   Medication Sig Start Date End Date Taking? Authorizing Provider  carvedilol (COREG CR) 20 MG 24 hr capsule Take 2 capsules (40 mg total) by mouth daily. 02/12/16  Yes Marinus Maw, MD  clopidogrel (PLAVIX) 75 MG tablet Take 75 mg by  mouth daily. 02/16/18  Yes [provider]  fluticasone (FLONASE) 50 MCG/ACT nasal spray Place 2 sprays into both nostrils daily as needed. ALLERGIES 04/27/15  Yes [provider]  furosemide (LASIX) 40 MG tablet Take 40 mg by mouth daily as needed (swelling).  04/10/11  Yes Marinus Maw, MD  hydrALAZINE (APRESOLINE) 25 MG tablet Take 25 mg by mouth daily. 01/29/17  Yes [provider]  hydrochlorothiazide (HYDRODIURIL) 25 MG tablet Take 25 mg by mouth daily. 02/16/18  Yes [provider]  isosorbide mononitrate (IMDUR) 30 MG 24 hr tablet Take 30 mg by mouth daily. 02/16/18  Yes [provider]  losartan (COZAAR) 50 MG tablet Take 2 tablets by mouth daily.  04/20/15  Yes [provider]  nitroGLYCERIN (NITROSTAT) 0.4 MG SL tablet Place 0.4 mg under the tongue as needed. 02/16/18  Yes [provider]  potassium chloride SA (K-DUR,KLOR-CON) 20 MEQ tablet Take 1 tablet by mouth daily. 06/10/11  Yes [provider]  RABEprazole (ACIPHEX) 20 MG tablet Take 20 mg by mouth daily.  09/07/12  Yes [provider]    Allergies:   Aspirin; Cardizem [diltiazem]; Digoxin and related; Diltiazem hcl; Metoprolol; Ouabain; Pantoprazole sodium; and Propafenone hcl er   Social History   Socioeconomic History  . Marital status: Married    Spouse name: Not on file  . Number of children: Not on file  . Years of education: Not on file  . Highest education level: Not on file  Occupational History  . Not on file  Social Needs  . Financial resource strain: Not on file  . Food insecurity:    Worry: Not on file    Inability: Not on file  . Transportation needs:    Medical: Not on file    Non-medical: Not on file  Tobacco Use  . Smoking status: Former Smoker    Packs/day: 0.33    Years: 7.00    Pack years: 2.31    Types: Cigarettes    Last attempt to quit: 1975    Years since quitting: 45.1  . Smokeless tobacco: Never Used  Substance and  Sexual Activity  . Alcohol use: No  . Drug use: No  . Sexual activity: Not Currently  Lifestyle  . Physical activity:    Days per week: Not on file    Minutes per session: Not on file  . Stress: Not on file  Relationships  . Social connections:    Talks on phone: Not on file    Gets together: Not on file    Attends religious service: Not on file    Active member of club or organization: Not on file    Attends meetings of clubs or organizations: Not on file    Relationship status: Not on file  Other Topics Concern  . Not on file  Social History Narrative  .  Not on file     Family History:  The patient's family history is not on file.   ROS:   Please see the history of present illness.    ROS All other systems reviewed and are negative.   PHYSICAL EXAM:   VS:  BP 112/60   Pulse 99   Ht 5\' 11"  (1.803 m)   Wt 220 lb (99.8 kg)   SpO2 96%   BMI 30.68 kg/m    GEN: Well nourished, well developed, in no acute distress  HEENT: normal  Neck: no JVD, carotid bruits, or masses Cardiac: *Irregularly irregular; no murmurs, rubs, or gallops,no edema  Respiratory:  clear to auscultation bilaterally, normal work of breathing GI: soft, nontender, nondistended, + BS MS: no deformity or atrophy  Skin: warm and dry, no rash Neuro:  Alert and Oriented x 3, Strength and sensation are intact Psych: euthymic mood, full affect  Wt Readings from Last 3 Encounters:  02/25/18 220 lb (99.8 kg)  04/24/17 220 lb (99.8 kg)  01/22/17 223 lb (101.2 kg)      Studies/Labs Reviewed:   EKG:  EKG is not ordered today.    Recent Labs: No results found for requested labs within last 8760 hours.   Lipid Panel No results found for: CHOL, TRIG, HDL, CHOLHDL, VLDL, LDLCALC, LDLDIRECT  Additional studies/ records that were reviewed today include:   As above:   ASSESSMENT & PLAN:   1. CAD - Cath as summarized above. Recommended medical therapy with plan to consider high risk PCI vs CABG for  recurrent symptoms.  Continue dual antiplatelet therapy with aspirin and Plavix.  Continue Coreg, Imdur and statin.  2. Permanent atrial fibrillation -Rate controlled on Coreg.  Patient currently off Xarelto weeks due to GI blood loss.  Colonoscopy with diverticulitis.  Patient has follow-up with PCP in upcoming weeks for reevaluation of anticoagulation.  Discussed with patient in details.  If plan to restart anticoagulation,  he will give [place] a call as patient on dual antiplatelet therapy with aspirin and Plavix.   3. S/p PPM -Followed by Dr. Ladona Ridgel.  4. HLD - LDL 112.  Intolerance to Lipitor.  He is tolerating Crestor 20 mg daily.  5.  Hypertension -Blood pressure stable on current medication.  6.  Chronic diastolic heart failure -Euvolemic.  Continue current therapy.    Medication Adjustments/Labs and Tests Ordered: Current medicines are reviewed at length with the patient today.  Concerns regarding medicines are outlined above.  Medication changes, Labs and Tests ordered today are listed in the Patient Instructions below. Patient Instructions  Medication Instructions:  Your physician recommends that you continue on your current medications as directed. Please refer to the Current Medication list given to you today.  If you need a refill on your cardiac medications before your next appointment, please call your pharmacy.   Lab work: None ordered  If you have labs (blood work) drawn today and your tests are completely normal, you will receive your results only by: Marland Kitchen MyChart Message (if you have MyChart) OR . A paper copy in the mail If you have any lab test that is abnormal or we need to change your treatment, we will call you to review the results.  Testing/Procedures: None ordered  Follow-Up: Your physician recommends that you schedule a follow-up appointment in: SEE DR. Ladona Ridgel IN April AS SCHEDULED    Any Other Special Instructions Will Be Listed Below (If  Applicable).       Signed, Manson Passey,  PA  02/25/2018 9:25 AM    [facility] [address] [place], [place], [place]  [address] Phone: [phone]; Fax: [phone]

## 2018-02-25 ENCOUNTER — Ambulatory Visit: Payer: Medicare Other | Admitting: Physician Assistant

## 2018-02-25 ENCOUNTER — Encounter (INDEPENDENT_AMBULATORY_CARE_PROVIDER_SITE_OTHER): Payer: Self-pay

## 2018-02-25 ENCOUNTER — Encounter: Payer: Self-pay | Admitting: Physician Assistant

## 2018-02-25 VITALS — BP 112/60 | HR 99 | Ht 71.0 in | Wt 220.0 lb

## 2018-02-25 DIAGNOSIS — Z950 Presence of cardiac pacemaker: Secondary | ICD-10-CM

## 2018-02-25 DIAGNOSIS — E785 Hyperlipidemia, unspecified: Secondary | ICD-10-CM

## 2018-02-25 DIAGNOSIS — I2510 Atherosclerotic heart disease of native coronary artery without angina pectoris: Secondary | ICD-10-CM

## 2018-02-25 DIAGNOSIS — I10 Essential (primary) hypertension: Secondary | ICD-10-CM | POA: Diagnosis not present

## 2018-02-25 DIAGNOSIS — I5032 Chronic diastolic (congestive) heart failure: Secondary | ICD-10-CM

## 2018-02-25 DIAGNOSIS — I482 Chronic atrial fibrillation, unspecified: Secondary | ICD-10-CM

## 2018-02-25 NOTE — Patient Instructions (Signed)
Medication Instructions:  Your physician recommends that you continue on your current medications as directed. Please refer to the Current Medication list given to you today.  If you need a refill on your cardiac medications before your next appointment, please call your pharmacy.   Lab work: None ordered  If you have labs (blood work) drawn today and your tests are completely normal, you will receive your results only by: Marland Kitchen MyChart Message (if you have MyChart) OR . A paper copy in the mail If you have any lab test that is abnormal or we need to change your treatment, we will call you to review the results.  Testing/Procedures: None ordered  Follow-Up: Your physician recommends that you schedule a follow-up appointment in: SEE DR. Ladona Ridgel IN April AS SCHEDULED    Any Other Special Instructions Will Be Listed Below (If Applicable).

## 2018-04-23 ENCOUNTER — Encounter: Payer: Medicare Other | Admitting: *Deleted

## 2018-04-23 ENCOUNTER — Telehealth: Payer: Self-pay

## 2018-04-23 ENCOUNTER — Other Ambulatory Visit: Payer: Self-pay

## 2018-04-23 NOTE — Telephone Encounter (Signed)
Left message for patient to remind of missed remote transmission.  

## 2018-04-26 ENCOUNTER — Telehealth: Payer: Self-pay | Admitting: Internal Medicine

## 2018-04-26 NOTE — Telephone Encounter (Signed)
New message  Patient set up for MyChart Video Visit. Patient and his wife walked through steps to get into visit for tomorrow. MyChart Video Visit Consent was sent throught Mychart.

## 2018-04-27 ENCOUNTER — Telehealth (INDEPENDENT_AMBULATORY_CARE_PROVIDER_SITE_OTHER): Payer: Medicare Other | Admitting: Internal Medicine

## 2018-04-27 DIAGNOSIS — I10 Essential (primary) hypertension: Secondary | ICD-10-CM | POA: Diagnosis not present

## 2018-04-27 DIAGNOSIS — E785 Hyperlipidemia, unspecified: Secondary | ICD-10-CM

## 2018-04-27 DIAGNOSIS — I482 Chronic atrial fibrillation, unspecified: Secondary | ICD-10-CM

## 2018-04-27 NOTE — Progress Notes (Signed)
Electrophysiology TeleHealth Note   Due to national recommendations of social distancing due to COVID 19, an audio/video telehealth visit is felt to be most appropriate for this patient at this time.  See MyChart message from today for the patient's consent to telehealth for [facility].   Date:  04/27/2018   ID:  [name] [name], DOB August 09, 1950, MRN [id number]  Location: patient's home  Provider location: [address] [place]  Evaluation Performed: Follow-up visit  PCP:  Donata Duff, DO  Cardiologist:  Lewayne Bunting, MD  Electrophysiologist:  Dr Ladona Ridgel  Chief Complaint:  "I have had some leg cramps but overall I am doing pretty good."  History of Present Illness:    [name] [name] is a 68 y.o. male who presents via audio/video conferencing for a telehealth visit today.  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 He has now chronic atrial fib, and developed long pauses associated with syncope. He underwent PPM insertion. He notes some leg cramps and had his lasix stopped and placed on HCTZ and feels better. He BP has been well controlled.Today, he denies symptoms of palpitations, chest pain, shortness of breath,  lower extremity edema, dizziness, presyncope, or syncope.  The patient is otherwise without complaint today.  The patient denies symptoms of fevers, chills, cough, or new SOB worrisome for COVID 19.  Past Medical History:  Diagnosis Date  . Arthritis    "all" (01/22/2017)  . ATRIAL FIBRILLATION 06/26/2008   Qualifier: Diagnosis of  By: Ladona Ridgel, MD, Jerrell Mylar   . BRADYCARDIA 06/06/2008   Qualifier: Diagnosis of  By: Flonnie Overman    . CAD (coronary artery disease), native coronary artery    a. Cath 02/2018 @ [facility] - 50-70% ostial Lcx; 50% diagonal; 25% ostial LAD; 25% mid LAD; 75% dLAD; 75% dRCA >>> medical therapy  . CARDIOMYOPATHY, SECONDARY 06/06/2008   Qualifier: Diagnosis of  By: Flonnie Overman    . chronic diastolic CHF   . Essential hypertension 06/06/2008   Qualifier: Diagnosis of  By: Flonnie Overman    . GERD (gastroesophageal reflux disease)   . Heart murmur   . High cholesterol   . Migraine    "none in the 2000s" (01/22/2017)  . PONV (postoperative nausea and vomiting)   . Presence of permanent cardiac pacemaker   . SINUSITIS 06/06/2008   Qualifier: Diagnosis of  By: Flonnie Overman    . 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 ATRIAL FIBRILLATION ABLATION    . CARDIAC CATHETERIZATION    . CARDIOVERSION  06/19/2005   DC cardioversion Hattie Perch 05/28/2010  . INSERT / REPLACE / REMOVE PACEMAKER  01/22/2017  . PACEMAKER IMPLANT N/A 01/22/2017   Procedure: PACEMAKER IMPLANT;  Surgeon: Marinus Maw, MD;  Location: [facility] INVASIVE CV LAB;  Service: Cardiovascular;  Laterality: N/A;  . SHOULDER ARTHROSCOPY WITH ROTATOR CUFF REPAIR Left ~ 2015    Current Outpatient Medications  Medication Sig Dispense Refill  . carvedilol (COREG CR) 20 MG 24 hr capsule Take 2 capsules (40 mg total) by mouth daily. 180 capsule 3  . clopidogrel (PLAVIX) 75 MG tablet Take 75 mg by mouth daily.    . fluticasone (FLONASE) 50 MCG/ACT nasal spray Place 2 sprays into both nostrils daily as needed. ALLERGIES    .  furosemide (LASIX) 40 MG tablet Take 40 mg by mouth daily as needed (swelling).     . hydrALAZINE (APRESOLINE) 25 MG tablet Take 25 mg by mouth daily.    . hydrochlorothiazide (HYDRODIURIL) 25 MG tablet Take 25 mg by mouth daily.    . isosorbide mononitrate (IMDUR) 30 MG 24 hr tablet Take 30 mg by mouth daily.    Marland Kitchen losartan (COZAAR) 50 MG tablet Take 2 tablets by mouth daily.     . nitroGLYCERIN (NITROSTAT) 0.4 MG SL tablet Place 0.4 mg under the tongue as needed.    . potassium chloride SA (K-DUR,KLOR-CON) 20 MEQ tablet Take 1 tablet by mouth daily.    .  RABEprazole (ACIPHEX) 20 MG tablet Take 20 mg by mouth daily.      No current facility-administered medications for this visit.     Allergies:   Aspirin; Cardizem [diltiazem]; Digoxin and related; Diltiazem hcl; Metoprolol; Ouabain; Pantoprazole sodium; and Propafenone hcl er   Social History:  The patient  reports that he quit smoking about 45 years ago. His smoking use included cigarettes. He has a 2.31 pack-year smoking history. He has never used smokeless tobacco. He reports that he does not drink alcohol or use drugs.   Family History:  The patient's  family history is not on file.   ROS:  Please see the history of present illness.   All other systems are personally reviewed and negative.    Exam:    Vital Signs:  BP - 126/85, P - 78, Wt - 221 lb Well appearing, alert and conversant, regular work of breathing,  good skin color Eyes- anicteric, neuro- grossly intact, skin- no apparent rash or lesions or cyanosis, mouth- oral mucosa is pink   Labs/Other Tests and Data Reviewed:    Recent Labs: No results found for requested labs within last 8760 hours.   Wt Readings from Last 3 Encounters:  02/25/18 220 lb (99.8 kg)  04/24/17 220 lb (99.8 kg)  01/22/17 223 lb (101.2 kg)     Other studies personally reviewed:  Last device remote is reviewed from PaceART PDF dated 1/20 which reveals normal device function, no arrhythmias except for atrial fib   ASSESSMENT & PLAN:    1.  Persistent atrial fib - his rates are controlled. He denies palpitations. He will continue his beta blocker for rate control. 2. Pauses - he is s/p PPM insertion.  3. HTN - his blood pressure is well controlled.  4. PPM - his last interogation looked good. He will be rechecked in the next couple of weeks remotely. 5. Leg cramps - these are due to the HCTZ. He probably needs more potassium. I encouraged him to have his blood drawn at home but if he is unable he can call us and arrange for a lab check in our  office if needed. 6. COVID 19 screen The patient denies symptoms of COVID 19 at this time.  The importance of social distancing was discussed today.  Follow-up:  6 months Next remote: in the next week or two  Current medicines are reviewed at length with the patient today.   The patient does not have concerns regarding his medicines.  The following changes were made today:  none  Labs/ tests ordered today include: none No orders of the defined types were placed in this encounter.    Patient Risk:  after full review of this patients clinical status, I feel that they are at moderate risk at this time.  Today, I have spent 25 minutes with the patient with telehealth technology discussing all of the above.    Signed, Lewayne Bunting, MD  04/27/2018 10:37 AM     [facility] [address] [place] [address] [phone] (office) [phone] (fax)

## 2018-07-28 ENCOUNTER — Ambulatory Visit (INDEPENDENT_AMBULATORY_CARE_PROVIDER_SITE_OTHER): Payer: Medicare Other | Admitting: *Deleted

## 2018-07-28 DIAGNOSIS — I442 Atrioventricular block, complete: Secondary | ICD-10-CM

## 2018-07-28 DIAGNOSIS — I495 Sick sinus syndrome: Secondary | ICD-10-CM

## 2018-07-28 LAB — CUP PACEART REMOTE DEVICE CHECK
Battery Remaining Longevity: 157 mo
Battery Voltage: 3.04 V
Brady Statistic AP VP Percent: 0.02 %
Brady Statistic AP VS Percent: 33.38 %
Brady Statistic AS VP Percent: 0 %
Brady Statistic AS VS Percent: 66.6 %
Brady Statistic RA Percent Paced: 32.67 %
Brady Statistic RV Percent Paced: 0.02 %
Date Time Interrogation Session: 20200715023915
Implantable Lead Implant Date: 20190110
Implantable Lead Implant Date: 20190110
Implantable Lead Location: 753860
Implantable Lead Location: 753860
Implantable Lead Model: 3830
Implantable Lead Model: 5076
Implantable Pulse Generator Implant Date: 20190110
Lead Channel Impedance Value: 380 Ohm
Lead Channel Impedance Value: 532 Ohm
Lead Channel Impedance Value: 551 Ohm
Lead Channel Impedance Value: 608 Ohm
Lead Channel Pacing Threshold Amplitude: 0.875 V
Lead Channel Pacing Threshold Pulse Width: 0.4 ms
Lead Channel Sensing Intrinsic Amplitude: 10.875 mV
Lead Channel Sensing Intrinsic Amplitude: 17.5 mV
Lead Channel Setting Pacing Amplitude: 2 V
Lead Channel Setting Pacing Amplitude: 2 V
Lead Channel Setting Pacing Pulse Width: 0.4 ms
Lead Channel Setting Sensing Sensitivity: 0.9 mV

## 2018-08-06 ENCOUNTER — Encounter: Payer: Self-pay | Admitting: Cardiology

## 2018-08-06 NOTE — Progress Notes (Signed)
Remote pacemaker transmission.   

## 2018-10-27 ENCOUNTER — Ambulatory Visit (INDEPENDENT_AMBULATORY_CARE_PROVIDER_SITE_OTHER): Payer: Medicare Other | Admitting: *Deleted

## 2018-10-27 DIAGNOSIS — I4891 Unspecified atrial fibrillation: Secondary | ICD-10-CM

## 2018-10-27 DIAGNOSIS — I442 Atrioventricular block, complete: Secondary | ICD-10-CM

## 2018-10-27 LAB — CUP PACEART REMOTE DEVICE CHECK
Battery Remaining Longevity: 154 mo
Battery Voltage: 3.03 V
Brady Statistic AP VP Percent: 0.02 %
Brady Statistic AP VS Percent: 35.92 %
Brady Statistic AS VP Percent: 0 %
Brady Statistic AS VS Percent: 64.06 %
Brady Statistic RA Percent Paced: 35.22 %
Brady Statistic RV Percent Paced: 0.02 %
Date Time Interrogation Session: 20201014043740
Implantable Lead Implant Date: 20190110
Implantable Lead Implant Date: 20190110
Implantable Lead Location: 753860
Implantable Lead Location: 753860
Implantable Lead Model: 3830
Implantable Lead Model: 5076
Implantable Pulse Generator Implant Date: 20190110
Lead Channel Impedance Value: 380 Ohm
Lead Channel Impedance Value: 475 Ohm
Lead Channel Impedance Value: 532 Ohm
Lead Channel Impedance Value: 532 Ohm
Lead Channel Pacing Threshold Amplitude: 0.875 V
Lead Channel Pacing Threshold Pulse Width: 0.4 ms
Lead Channel Sensing Intrinsic Amplitude: 13.5 mV
Lead Channel Sensing Intrinsic Amplitude: 13.5 mV
Lead Channel Sensing Intrinsic Amplitude: 9.25 mV
Lead Channel Sensing Intrinsic Amplitude: 9.25 mV
Lead Channel Setting Pacing Amplitude: 2 V
Lead Channel Setting Pacing Amplitude: 2 V
Lead Channel Setting Pacing Pulse Width: 0.4 ms
Lead Channel Setting Sensing Sensitivity: 0.9 mV

## 2018-11-08 NOTE — Progress Notes (Signed)
Remote pacemaker transmission.   

## 2018-12-24 ENCOUNTER — Ambulatory Visit (INDEPENDENT_AMBULATORY_CARE_PROVIDER_SITE_OTHER): Payer: Medicare Other | Admitting: Internal Medicine

## 2018-12-24 ENCOUNTER — Encounter: Payer: Medicare Other | Admitting: Internal Medicine

## 2018-12-24 ENCOUNTER — Encounter: Payer: Self-pay | Admitting: Internal Medicine

## 2018-12-24 ENCOUNTER — Other Ambulatory Visit: Payer: Self-pay

## 2018-12-24 VITALS — BP 118/62 | HR 82 | Ht 71.0 in | Wt 224.0 lb

## 2018-12-24 DIAGNOSIS — Z950 Presence of cardiac pacemaker: Secondary | ICD-10-CM | POA: Diagnosis not present

## 2018-12-24 DIAGNOSIS — I482 Chronic atrial fibrillation, unspecified: Secondary | ICD-10-CM | POA: Diagnosis not present

## 2018-12-24 DIAGNOSIS — I442 Atrioventricular block, complete: Secondary | ICD-10-CM

## 2018-12-24 DIAGNOSIS — I10 Essential (primary) hypertension: Secondary | ICD-10-CM

## 2018-12-24 NOTE — Progress Notes (Signed)
HPI [name] returns today for followup. He is a pleasant 68 yo man with chronic atrial fib, HTN, chronic diastolic heart failure who underwent PPM insertion over a year ago for transient CHB with long pauses associated with syncope and near syncope. In the interim he has done well with no chest pain or syncope. He does have dyspnea with exertion although this is a fleeting symptom. He admits to dietary indiscretion. He has minimal palpitations. No syncope. Allergies  Allergen Reactions  . Aspirin Other (See Comments)    Unknown  . Cardizem [Diltiazem]     Unknown  . Digoxin And Related Other (See Comments)    unknown  . Diltiazem Hcl     Generalized Edema (intolerance)   . Metoprolol Other (See Comments)    Fluid retention  . Ouabain     Generalized Edema (intolerance)   . Pantoprazole Sodium Other (See Comments)    Stomach pain  . Propafenone Hcl Er Other (See Comments)    H/A, N/V, stomach upset     Current Outpatient Medications  Medication Sig Dispense Refill  . aspirin 81 MG EC tablet Take 1 tablet by mouth daily.    . carvedilol (COREG CR) 20 MG 24 hr capsule Take 2 capsules (40 mg total) by mouth daily. 180 capsule 3  . clopidogrel (PLAVIX) 75 MG tablet Take 75 mg by mouth daily.    . fluticasone (FLONASE) 50 MCG/ACT nasal spray Place 2 sprays into both nostrils daily as needed. ALLERGIES    . furosemide (LASIX) 40 MG tablet Take 40 mg by mouth daily as needed (swelling).     . hydrALAZINE (APRESOLINE) 25 MG tablet Take 25 mg by mouth daily.    . hydrochlorothiazide (HYDRODIURIL) 25 MG tablet Take 25 mg by mouth daily.    . isosorbide mononitrate (IMDUR) 30 MG 24 hr tablet Take 30 mg by mouth daily.    Marland Kitchen losartan (COZAAR) 50 MG tablet Take 2 tablets by mouth daily.     . nitroGLYCERIN (NITROSTAT) 0.4 MG SL tablet Place 0.4 mg under the tongue as needed.    . potassium chloride SA (K-DUR,KLOR-CON) 20 MEQ tablet Take 1 tablet by mouth daily.    . RABEprazole  (ACIPHEX) 20 MG tablet Take 20 mg by mouth daily.     . rosuvastatin (CRESTOR) 10 MG tablet Take 10 mg by mouth daily.     No current facility-administered medications for this visit.     Past Medical History:  Diagnosis Date  . Arthritis    "all" (01/22/2017)  . ATRIAL FIBRILLATION 06/26/2008   Qualifier: Diagnosis of  By: Ladona Ridgel, MD, Jerrell Mylar   . BRADYCARDIA 06/06/2008   Qualifier: Diagnosis of  By: Flonnie Overman    . CAD (coronary artery disease), native coronary artery    a. Cath 02/2018 @ [facility] - 50-70% ostial Lcx; 50% diagonal; 25% ostial LAD; 25% mid LAD; 75% dLAD; 75% dRCA >>> medical therapy  . CARDIOMYOPATHY, SECONDARY 06/06/2008   Qualifier: Diagnosis of  By: Flonnie Overman    . chronic diastolic CHF   . Essential hypertension 06/06/2008   Qualifier: Diagnosis of  By: Flonnie Overman    . GERD (gastroesophageal reflux disease)   . Heart murmur   . High cholesterol   . Migraine    "none in the 2000s" (01/22/2017)  . PONV (postoperative nausea and vomiting)   . Presence of permanent cardiac pacemaker   . SINUSITIS 06/06/2008   Qualifier: Diagnosis of  By: Flonnie Overman    . 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   Past Surgical History:  Procedure Laterality Date  . ATRIAL FIBRILLATION ABLATION    . CARDIAC CATHETERIZATION    . CARDIOVERSION  06/19/2005   DC cardioversion Hattie Perch 05/28/2010  . INSERT / REPLACE / REMOVE PACEMAKER  01/22/2017  . PACEMAKER IMPLANT N/A 01/22/2017   Procedure: PACEMAKER IMPLANT;  Surgeon: Marinus Maw, MD;  Location: [facility] INVASIVE CV LAB;  Service: Cardiovascular;  Laterality: N/A;  . SHOULDER ARTHROSCOPY WITH ROTATOR CUFF REPAIR Left ~ 2015     History reviewed. No pertinent family history.   Social History   Socioeconomic History  . Marital status: Married    Spouse  name: Not on file  . Number of children: Not on file  . Years of education: Not on file  . Highest education level: Not on file  Occupational History  . Not on file  Tobacco Use  . Smoking status: Former Smoker    Packs/day: 0.33    Years: 7.00    Pack years: 2.31    Types: Cigarettes    Quit date: 1975    Years since quitting: 45.9  . Smokeless tobacco: Never Used  Substance and Sexual Activity  . Alcohol use: No  . Drug use: No  . Sexual activity: Not Currently  Other Topics Concern  . Not on file  Social History Narrative  . Not on file   Social Determinants of Health   Financial Resource Strain:   . Difficulty of Paying Living Expenses: Not on file  Food Insecurity:   . Worried About Programme researcher, broadcasting/film/video in the Last Year: Not on file  . Ran Out of Food in the Last Year: Not on file  Transportation Needs:   . Lack of Transportation (Medical): Not on file  . Lack of Transportation (Non-Medical): Not on file  Physical Activity:   . Days of Exercise per Week: Not on file  . Minutes of Exercise per Session: Not on file  Stress:   . Feeling of Stress : Not on file  Social Connections:   . Frequency of Communication with Friends and Family: Not on file  . Frequency of Social Gatherings with Friends and Family: Not on file  . Attends Religious Services: Not on file  . Active Member of Clubs or Organizations: Not on file  . Attends Banker Meetings: Not on file  . Marital Status: Not on file  Intimate Partner Violence:   . Fear of Current or Ex-Partner: Not on file  . Emotionally Abused: Not on file  . Physically Abused: Not on file  . Sexually Abused: Not on file     BP 118/62   Pulse 82   Ht 5\' 11"  (1.803 m)   Wt 224 lb (101.6 kg)   SpO2 96%   BMI 31.24 kg/m   Physical Exam:  Well appearing NAD HEENT: Unremarkable Neck:  No JVD, no thyromegally Lymphatics:  No adenopathy Back:  No CVA tenderness Lungs:  Clear with no wheezes HEART:   Regular rate rhythm, no murmurs, no rubs, no clicks Abd:  soft, positive bowel sounds, no organomegally, no rebound, no guarding Ext:  2 plus pulses, no edema, no cyanosis, no clubbing Skin:  No rashes no nodules Neuro:  CN II through  XII intact, motor grossly intact  EKG - atrial fib with a controlled VR and occaisional ventricular pacing  DEVICE  Normal device function.  See PaceArt for details.   Assess/Plan: 1. Atrial fib - his VR is well controlled. He will continue his current meds. 2. HTN - his bp is well controlled today. He admits to some sodium indiscretion. I asked him to maintain a low sodium diet. 3. WPW - he has no residual pre-excitation. He has no symptoms of SVT. 4. PPM - his medtronic DDD PM with a His bundle lead in the RA port and a RV lead in the RV port are all working normally.   Mikle Bosworth.D.

## 2018-12-24 NOTE — Patient Instructions (Addendum)
Medication Instructions:  Your physician recommends that you continue on your current medications as directed. Please refer to the Current Medication list given to you today.  Labwork: None ordered.  Testing/Procedures: None ordered.  Follow-Up: Your physician wants you to follow-up in: 6 months with Dr. Lovena Le.   You will receive a reminder letter in the mail two months in advance. If you don't receive a letter, please call our office to schedule the follow-up appointment.  Remote monitoring is used to monitor your Pacemaker from home. This monitoring reduces the number of office visits required to check your device to one time per year. It allows [place] to keep an eye on the functioning of your device to ensure it is working properly. You are scheduled for a device check from home on 01/26/2019. You may send your transmission at any time that day. If you have a wireless device, the transmission will be sent automatically. After your physician reviews your transmission, you will receive a postcard with your next transmission date.  Any Other Special Instructions Will Be Listed Below (If Applicable).  If you need a refill on your cardiac medications before your next appointment, please call your pharmacy.

## 2019-01-26 ENCOUNTER — Ambulatory Visit (INDEPENDENT_AMBULATORY_CARE_PROVIDER_SITE_OTHER): Payer: Medicare PPO | Admitting: *Deleted

## 2019-01-26 DIAGNOSIS — I442 Atrioventricular block, complete: Secondary | ICD-10-CM

## 2019-01-26 LAB — CUP PACEART REMOTE DEVICE CHECK
Battery Remaining Longevity: 151 mo
Battery Voltage: 3.03 V
Brady Statistic AP VP Percent: 0.02 %
Brady Statistic AP VS Percent: 39.57 %
Brady Statistic AS VP Percent: 0 %
Brady Statistic AS VS Percent: 60.41 %
Brady Statistic RA Percent Paced: 38.84 %
Brady Statistic RV Percent Paced: 0.02 %
Date Time Interrogation Session: 20210113002151
Implantable Lead Implant Date: 20190110
Implantable Lead Implant Date: 20190110
Implantable Lead Location: 753860
Implantable Lead Location: 753860
Implantable Lead Model: 3830
Implantable Lead Model: 5076
Implantable Pulse Generator Implant Date: 20190110
Lead Channel Impedance Value: 380 Ohm
Lead Channel Impedance Value: 494 Ohm
Lead Channel Impedance Value: 551 Ohm
Lead Channel Impedance Value: 570 Ohm
Lead Channel Pacing Threshold Amplitude: 0.875 V
Lead Channel Pacing Threshold Pulse Width: 0.4 ms
Lead Channel Sensing Intrinsic Amplitude: 11.375 mV
Lead Channel Sensing Intrinsic Amplitude: 11.375 mV
Lead Channel Sensing Intrinsic Amplitude: 19 mV
Lead Channel Sensing Intrinsic Amplitude: 19 mV
Lead Channel Setting Pacing Amplitude: 2 V
Lead Channel Setting Pacing Amplitude: 2 V
Lead Channel Setting Pacing Pulse Width: 0.4 ms
Lead Channel Setting Sensing Sensitivity: 0.9 mV

## 2019-04-27 ENCOUNTER — Ambulatory Visit (INDEPENDENT_AMBULATORY_CARE_PROVIDER_SITE_OTHER): Payer: Medicare PPO | Admitting: *Deleted

## 2019-04-27 DIAGNOSIS — I442 Atrioventricular block, complete: Secondary | ICD-10-CM

## 2019-04-27 LAB — CUP PACEART REMOTE DEVICE CHECK
Battery Remaining Longevity: 148 mo
Battery Voltage: 3.03 V
Brady Statistic AP VP Percent: 0.02 %
Brady Statistic AP VS Percent: 34.62 %
Brady Statistic AS VP Percent: 0 %
Brady Statistic AS VS Percent: 65.36 %
Brady Statistic RA Percent Paced: 33.95 %
Brady Statistic RV Percent Paced: 0.02 %
Date Time Interrogation Session: 20210413192046
Implantable Lead Implant Date: 20190110
Implantable Lead Implant Date: 20190110
Implantable Lead Location: 753860
Implantable Lead Location: 753860
Implantable Lead Model: 3830
Implantable Lead Model: 5076
Implantable Pulse Generator Implant Date: 20190110
Lead Channel Impedance Value: 380 Ohm
Lead Channel Impedance Value: 456 Ohm
Lead Channel Impedance Value: 532 Ohm
Lead Channel Impedance Value: 532 Ohm
Lead Channel Pacing Threshold Amplitude: 0.875 V
Lead Channel Pacing Threshold Pulse Width: 0.4 ms
Lead Channel Sensing Intrinsic Amplitude: 14.125 mV
Lead Channel Sensing Intrinsic Amplitude: 14.125 mV
Lead Channel Sensing Intrinsic Amplitude: 9 mV
Lead Channel Sensing Intrinsic Amplitude: 9 mV
Lead Channel Setting Pacing Amplitude: 2 V
Lead Channel Setting Pacing Amplitude: 2 V
Lead Channel Setting Pacing Pulse Width: 0.4 ms
Lead Channel Setting Sensing Sensitivity: 0.9 mV

## 2019-04-27 NOTE — Progress Notes (Signed)
PPM Remote  

## 2019-07-27 ENCOUNTER — Ambulatory Visit (INDEPENDENT_AMBULATORY_CARE_PROVIDER_SITE_OTHER): Payer: Medicare PPO | Admitting: *Deleted

## 2019-07-27 DIAGNOSIS — I442 Atrioventricular block, complete: Secondary | ICD-10-CM

## 2019-07-27 LAB — CUP PACEART REMOTE DEVICE CHECK
Battery Remaining Longevity: 145 mo
Battery Voltage: 3.02 V
Brady Statistic AP VP Percent: 0.02 %
Brady Statistic AP VS Percent: 35.92 %
Brady Statistic AS VP Percent: 0 %
Brady Statistic AS VS Percent: 64.06 %
Brady Statistic RA Percent Paced: 35.29 %
Brady Statistic RV Percent Paced: 0.02 %
Date Time Interrogation Session: 20210714062834
Implantable Lead Implant Date: 20190110
Implantable Lead Implant Date: 20190110
Implantable Lead Location: 753860
Implantable Lead Location: 753860
Implantable Lead Model: 3830
Implantable Lead Model: 5076
Implantable Pulse Generator Implant Date: 20190110
Lead Channel Impedance Value: 380 Ohm
Lead Channel Impedance Value: 475 Ohm
Lead Channel Impedance Value: 532 Ohm
Lead Channel Impedance Value: 551 Ohm
Lead Channel Pacing Threshold Amplitude: 0.875 V
Lead Channel Pacing Threshold Pulse Width: 0.4 ms
Lead Channel Sensing Intrinsic Amplitude: 13.25 mV
Lead Channel Sensing Intrinsic Amplitude: 13.25 mV
Lead Channel Sensing Intrinsic Amplitude: 9.5 mV
Lead Channel Sensing Intrinsic Amplitude: 9.5 mV
Lead Channel Setting Pacing Amplitude: 2 V
Lead Channel Setting Pacing Amplitude: 2 V
Lead Channel Setting Pacing Pulse Width: 0.4 ms
Lead Channel Setting Sensing Sensitivity: 0.9 mV

## 2019-07-28 ENCOUNTER — Encounter: Payer: Self-pay | Admitting: Internal Medicine

## 2019-07-28 ENCOUNTER — Ambulatory Visit: Payer: Medicare PPO | Admitting: Internal Medicine

## 2019-07-28 ENCOUNTER — Other Ambulatory Visit: Payer: Self-pay

## 2019-07-28 VITALS — BP 122/70 | HR 78 | Ht 71.0 in | Wt 231.8 lb

## 2019-07-28 DIAGNOSIS — Z950 Presence of cardiac pacemaker: Secondary | ICD-10-CM

## 2019-07-28 DIAGNOSIS — I482 Chronic atrial fibrillation, unspecified: Secondary | ICD-10-CM | POA: Diagnosis not present

## 2019-07-28 DIAGNOSIS — I10 Essential (primary) hypertension: Secondary | ICD-10-CM

## 2019-07-28 DIAGNOSIS — I442 Atrioventricular block, complete: Secondary | ICD-10-CM | POA: Diagnosis not present

## 2019-07-28 MED ORDER — NITROGLYCERIN 0.4 MG SL SUBL: 0.4000 mg | SUBLINGUAL_TABLET | SUBLINGUAL | 3 refills | Status: AC | PRN

## 2019-07-28 NOTE — Progress Notes (Signed)
HPI [name] [name] returns today for followup. He is a pleasant 69 yo man with chronic atrial fib and symptomatic tachy-brady syndrome with syncope who underwent PPM insertion in Jan. 2019. The patient has done well in the interim. He has diastolic heart failure. He denies palpitations. He has previously had fairly difficult to control HTN. He has tried to be more careful with his salt intake though he admits to eating too much during Covid.  Allergies  Allergen Reactions  . Aspirin Other (See Comments)    Unknown  . Cardizem [Diltiazem]     Unknown  . Digoxin And Related Other (See Comments)    unknown  . Diltiazem Hcl     Generalized Edema (intolerance)   . Metoprolol Other (See Comments)    Fluid retention  . Ouabain     Generalized Edema (intolerance)   . Pantoprazole Sodium Other (See Comments)    Stomach pain  . Propafenone Hcl Er Other (See Comments)    H/A, N/V, stomach upset     Current Outpatient Medications  Medication Sig Dispense Refill  . aspirin 81 MG EC tablet Take 1 tablet by mouth daily.    . carvedilol (COREG CR) 20 MG 24 hr capsule Take 2 capsules (40 mg total) by mouth daily. 180 capsule 3  . clopidogrel (PLAVIX) 75 MG tablet Take 75 mg by mouth daily.    . fluticasone (FLONASE) 50 MCG/ACT nasal spray Place 2 sprays into both nostrils daily as needed. ALLERGIES    . furosemide (LASIX) 40 MG tablet Take 40 mg by mouth daily as needed (swelling).     . hydrochlorothiazide (HYDRODIURIL) 25 MG tablet Take 25 mg by mouth daily.    . isosorbide mononitrate (IMDUR) 30 MG 24 hr tablet Take 30 mg by mouth daily.    . nitroGLYCERIN (NITROSTAT) 0.4 MG SL tablet Place 1 tablet (0.4 mg total) under the tongue as needed. 30 tablet 3  . potassium chloride SA (K-DUR,KLOR-CON) 20 MEQ tablet Take 1 tablet by mouth daily.    . RABEprazole (ACIPHEX) 20 MG tablet Take 20 mg by mouth daily.     . rosuvastatin (CRESTOR) 10 MG tablet Take 10 mg by mouth daily.    .  hydrALAZINE (APRESOLINE) 25 MG tablet Take 25 mg by mouth daily. (Patient not taking: Reported on 07/28/2019)    . losartan (COZAAR) 50 MG tablet Take 2 tablets by mouth daily.  (Patient not taking: Reported on 07/28/2019)     No current facility-administered medications for this visit.     Past Medical History:  Diagnosis Date  . Arthritis    "all" (01/22/2017)  . ATRIAL FIBRILLATION 06/26/2008   Qualifier: Diagnosis of  By: Ladona Ridgel, MD, Jerrell Mylar   . BRADYCARDIA 06/06/2008   Qualifier: Diagnosis of  By: Flonnie Overman    . CAD (coronary artery disease), native coronary artery    a. Cath 02/2018 @ [facility] - 50-70% ostial Lcx; 50% diagonal; 25% ostial LAD; 25% mid LAD; 75% dLAD; 75% dRCA >>> medical therapy  . CARDIOMYOPATHY, SECONDARY 06/06/2008   Qualifier: Diagnosis of  By: Flonnie Overman    . chronic diastolic CHF   . Essential hypertension 06/06/2008   Qualifier: Diagnosis of  By: Flonnie Overman    . GERD (gastroesophageal reflux disease)   . Heart murmur   . High cholesterol   . Migraine    "none in the 2000s" (01/22/2017)  . PONV (postoperative nausea and vomiting)   . Presence  of permanent cardiac pacemaker   . SINUSITIS 06/06/2008   Qualifier: Diagnosis of  By: Flonnie Overman    . 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   Past Surgical History:  Procedure Laterality Date  . ATRIAL FIBRILLATION ABLATION    . CARDIAC CATHETERIZATION    . CARDIOVERSION  06/19/2005   DC cardioversion Hattie Perch 05/28/2010  . INSERT / REPLACE / REMOVE PACEMAKER  01/22/2017  . PACEMAKER IMPLANT N/A 01/22/2017   Procedure: PACEMAKER IMPLANT;  Surgeon: Marinus Maw, MD;  Location: [facility] INVASIVE CV LAB;  Service: Cardiovascular;  Laterality: N/A;  . SHOULDER ARTHROSCOPY WITH ROTATOR CUFF REPAIR Left ~ 2015     History reviewed. No  pertinent family history.   Social History   Socioeconomic History  . Marital status: Married    Spouse name: Not on file  . Number of children: Not on file  . Years of education: Not on file  . Highest education level: Not on file  Occupational History  . Not on file  Tobacco Use  . Smoking status: Former Smoker    Packs/day: 0.33    Years: 7.00    Pack years: 2.31    Types: Cigarettes    Quit date: 1975    Years since quitting: 46.5  . Smokeless tobacco: Never Used  Vaping Use  . Vaping Use: Never used  Substance and Sexual Activity  . Alcohol use: No  . Drug use: No  . Sexual activity: Not Currently  Other Topics Concern  . Not on file  Social History Narrative  . Not on file   Social Determinants of Health   Financial Resource Strain:   . Difficulty of Paying Living Expenses:   Food Insecurity:   . Worried About Programme researcher, broadcasting/film/video in the Last Year:   . Barista in the Last Year:   Transportation Needs:   . Freight forwarder (Medical):   Marland Kitchen Lack of Transportation (Non-Medical):   Physical Activity:   . Days of Exercise per Week:   . Minutes of Exercise per Session:   Stress:   . Feeling of Stress :   Social Connections:   . Frequency of Communication with Friends and Family:   . Frequency of Social Gatherings with Friends and Family:   . Attends Religious Services:   . Active Member of Clubs or Organizations:   . Attends Banker Meetings:   Marland Kitchen Marital Status:   Intimate Partner Violence:   . Fear of Current or Ex-Partner:   . Emotionally Abused:   Marland Kitchen Physically Abused:   . Sexually Abused:      BP 122/70   Pulse 78   Ht 5\' 11"  (1.803 m)   Wt 231 lb 12.8 oz (105.1 kg)   SpO2 95%   BMI 32.33 kg/m   Physical Exam:  Well appearing middle aged man,  NAD HEENT: Unremarkable Neck:  6 cm JVD, no thyromegally Lymphatics:  No adenopathy Back:  No CVA tenderness Lungs:  Clear with no wheezes HEART:  IRegular rate rhythm, no  murmurs, no rubs, no clicks Abd:  soft, positive bowel sounds, no organomegally, no rebound, no guarding Ext:  2 plus pulses, no edema, no cyanosis, no clubbing Skin:  No rashes no nodules Neuro:  CN II through XII intact, motor grossly intact  EKG - atrial fib with a controlled VR and occaisional ventricular pacing and appropriate sensing.   DEVICE  Normal device function.  See PaceArt for details.   Assess/Plan: 1. Atrial fib - his rates are controlled. He is pacing about 35% of the time.  2. 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  4. Diastolic heart failure - his symptoms are class 2. He will continue his current meds.  Dorathy Daft.

## 2019-07-28 NOTE — Progress Notes (Signed)
Remote pacemaker transmission.   

## 2019-07-28 NOTE — Patient Instructions (Signed)

## 2019-10-26 ENCOUNTER — Ambulatory Visit (INDEPENDENT_AMBULATORY_CARE_PROVIDER_SITE_OTHER): Payer: Medicare PPO

## 2019-10-26 DIAGNOSIS — I495 Sick sinus syndrome: Secondary | ICD-10-CM

## 2019-10-28 LAB — CUP PACEART REMOTE DEVICE CHECK
Battery Remaining Longevity: 142 mo
Battery Voltage: 3.02 V
Brady Statistic AP VP Percent: 0.02 %
Brady Statistic AP VS Percent: 38.02 %
Brady Statistic AS VP Percent: 0 %
Brady Statistic AS VS Percent: 61.96 %
Brady Statistic RA Percent Paced: 37.34 %
Brady Statistic RV Percent Paced: 0.02 %
Date Time Interrogation Session: 20211013013924
Implantable Lead Implant Date: 20190110
Implantable Lead Implant Date: 20190110
Implantable Lead Location: 753860
Implantable Lead Location: 753860
Implantable Lead Model: 3830
Implantable Lead Model: 5076
Implantable Pulse Generator Implant Date: 20190110
Lead Channel Impedance Value: 399 Ohm
Lead Channel Impedance Value: 513 Ohm
Lead Channel Impedance Value: 570 Ohm
Lead Channel Impedance Value: 570 Ohm
Lead Channel Pacing Threshold Amplitude: 0.875 V
Lead Channel Pacing Threshold Pulse Width: 0.4 ms
Lead Channel Sensing Intrinsic Amplitude: 10.125 mV
Lead Channel Sensing Intrinsic Amplitude: 10.125 mV
Lead Channel Sensing Intrinsic Amplitude: 14.25 mV
Lead Channel Sensing Intrinsic Amplitude: 14.25 mV
Lead Channel Setting Pacing Amplitude: 2 V
Lead Channel Setting Pacing Amplitude: 2 V
Lead Channel Setting Pacing Pulse Width: 0.4 ms
Lead Channel Setting Sensing Sensitivity: 0.9 mV

## 2019-10-31 NOTE — Progress Notes (Signed)
Remote pacemaker transmission.   

## 2020-01-25 ENCOUNTER — Ambulatory Visit (INDEPENDENT_AMBULATORY_CARE_PROVIDER_SITE_OTHER): Payer: Medicare PPO

## 2020-01-25 DIAGNOSIS — I495 Sick sinus syndrome: Secondary | ICD-10-CM | POA: Diagnosis not present

## 2020-01-25 LAB — CUP PACEART REMOTE DEVICE CHECK
Battery Remaining Longevity: 138 mo
Battery Voltage: 3.02 V
Brady Statistic AP VP Percent: 0.02 %
Brady Statistic AP VS Percent: 37.05 %
Brady Statistic AS VP Percent: 0 %
Brady Statistic AS VS Percent: 62.92 %
Brady Statistic RA Percent Paced: 36.41 %
Brady Statistic RV Percent Paced: 0.02 %
Date Time Interrogation Session: 20220112021718
Implantable Lead Implant Date: 20190110
Implantable Lead Implant Date: 20190110
Implantable Lead Location: 753860
Implantable Lead Location: 753860
Implantable Lead Model: 3830
Implantable Lead Model: 5076
Implantable Pulse Generator Implant Date: 20190110
Lead Channel Impedance Value: 380 Ohm
Lead Channel Impedance Value: 475 Ohm
Lead Channel Impedance Value: 532 Ohm
Lead Channel Impedance Value: 551 Ohm
Lead Channel Pacing Threshold Amplitude: 0.875 V
Lead Channel Pacing Threshold Pulse Width: 0.4 ms
Lead Channel Sensing Intrinsic Amplitude: 14 mV
Lead Channel Sensing Intrinsic Amplitude: 14 mV
Lead Channel Sensing Intrinsic Amplitude: 9.5 mV
Lead Channel Sensing Intrinsic Amplitude: 9.5 mV
Lead Channel Setting Pacing Amplitude: 2 V
Lead Channel Setting Pacing Amplitude: 2 V
Lead Channel Setting Pacing Pulse Width: 0.4 ms
Lead Channel Setting Sensing Sensitivity: 0.9 mV

## 2020-02-07 NOTE — Progress Notes (Signed)
Remote pacemaker transmission.   

## 2020-04-25 ENCOUNTER — Ambulatory Visit (INDEPENDENT_AMBULATORY_CARE_PROVIDER_SITE_OTHER): Payer: Medicare PPO

## 2020-04-25 DIAGNOSIS — I442 Atrioventricular block, complete: Secondary | ICD-10-CM

## 2020-04-26 LAB — CUP PACEART REMOTE DEVICE CHECK
Battery Remaining Longevity: 135 mo
Battery Voltage: 3.02 V
Brady Statistic AP VP Percent: 0.02 %
Brady Statistic AP VS Percent: 35.45 %
Brady Statistic AS VP Percent: 0 %
Brady Statistic AS VS Percent: 64.52 %
Brady Statistic RA Percent Paced: 34.86 %
Brady Statistic RV Percent Paced: 0.02 %
Date Time Interrogation Session: 20220412234237
Implantable Lead Implant Date: 20190110
Implantable Lead Implant Date: 20190110
Implantable Lead Location: 753860
Implantable Lead Location: 753860
Implantable Lead Model: 3830
Implantable Lead Model: 5076
Implantable Pulse Generator Implant Date: 20190110
Lead Channel Impedance Value: 380 Ohm
Lead Channel Impedance Value: 475 Ohm
Lead Channel Impedance Value: 532 Ohm
Lead Channel Impedance Value: 532 Ohm
Lead Channel Pacing Threshold Amplitude: 0.875 V
Lead Channel Pacing Threshold Pulse Width: 0.4 ms
Lead Channel Sensing Intrinsic Amplitude: 14.25 mV
Lead Channel Sensing Intrinsic Amplitude: 14.25 mV
Lead Channel Sensing Intrinsic Amplitude: 9 mV
Lead Channel Sensing Intrinsic Amplitude: 9 mV
Lead Channel Setting Pacing Amplitude: 2 V
Lead Channel Setting Pacing Amplitude: 2 V
Lead Channel Setting Pacing Pulse Width: 0.4 ms
Lead Channel Setting Sensing Sensitivity: 0.9 mV

## 2020-05-10 NOTE — Progress Notes (Signed)
Remote pacemaker transmission.   

## 2020-07-25 ENCOUNTER — Ambulatory Visit (INDEPENDENT_AMBULATORY_CARE_PROVIDER_SITE_OTHER): Payer: Medicare PPO

## 2020-07-25 DIAGNOSIS — I442 Atrioventricular block, complete: Secondary | ICD-10-CM | POA: Diagnosis not present

## 2020-07-25 LAB — CUP PACEART REMOTE DEVICE CHECK
Battery Remaining Longevity: 132 mo
Battery Voltage: 3.01 V
Brady Statistic AP VP Percent: 0.02 %
Brady Statistic AP VS Percent: 33.5 %
Brady Statistic AS VP Percent: 0 %
Brady Statistic AS VS Percent: 66.48 %
Brady Statistic RA Percent Paced: 32.94 %
Brady Statistic RV Percent Paced: 0.02 %
Date Time Interrogation Session: 20220713064428
Implantable Lead Implant Date: 20190110
Implantable Lead Implant Date: 20190110
Implantable Lead Location: 753860
Implantable Lead Location: 753860
Implantable Lead Model: 3830
Implantable Lead Model: 5076
Implantable Pulse Generator Implant Date: 20190110
Lead Channel Impedance Value: 380 Ohm
Lead Channel Impedance Value: 475 Ohm
Lead Channel Impedance Value: 532 Ohm
Lead Channel Impedance Value: 551 Ohm
Lead Channel Pacing Threshold Amplitude: 0.875 V
Lead Channel Pacing Threshold Pulse Width: 0.4 ms
Lead Channel Sensing Intrinsic Amplitude: 10.875 mV
Lead Channel Sensing Intrinsic Amplitude: 10.875 mV
Lead Channel Sensing Intrinsic Amplitude: 16.75 mV
Lead Channel Sensing Intrinsic Amplitude: 16.75 mV
Lead Channel Setting Pacing Amplitude: 2 V
Lead Channel Setting Pacing Amplitude: 2 V
Lead Channel Setting Pacing Pulse Width: 0.4 ms
Lead Channel Setting Sensing Sensitivity: 0.9 mV

## 2020-08-17 NOTE — Progress Notes (Signed)
Remote pacemaker transmission.   

## 2020-08-24 ENCOUNTER — Other Ambulatory Visit: Payer: Self-pay

## 2020-08-24 ENCOUNTER — Ambulatory Visit: Payer: Medicare PPO | Admitting: Internal Medicine

## 2020-08-24 VITALS — BP 146/70 | HR 81 | Ht 71.0 in | Wt 233.0 lb

## 2020-08-24 DIAGNOSIS — I442 Atrioventricular block, complete: Secondary | ICD-10-CM

## 2020-08-24 DIAGNOSIS — I482 Chronic atrial fibrillation, unspecified: Secondary | ICD-10-CM | POA: Diagnosis not present

## 2020-08-24 DIAGNOSIS — Z950 Presence of cardiac pacemaker: Secondary | ICD-10-CM

## 2020-08-24 DIAGNOSIS — I495 Sick sinus syndrome: Secondary | ICD-10-CM | POA: Diagnosis not present

## 2020-08-24 DIAGNOSIS — I10 Essential (primary) hypertension: Secondary | ICD-10-CM

## 2020-08-24 NOTE — Progress Notes (Addendum)
HPI [name] returns today for followup. He is a pleasant 70 yo man with chronic atrial fib and symptomatic tachy-brady syndrome with syncope who underwent PPM insertion in Jan. 2019 due to long symptomatic pauses. The patient has done well in the interim. He has diastolic heart failure. He denies palpitations. He has previously had fairly difficult to control HTN. He has tried to be more careful with his salt intake though he admits to eating too much during Covid. he has continued to gain weight.  Allergies  Allergen Reactions   Aspirin Other (See Comments)    Unknown   Cardizem [Diltiazem]     Unknown   Digoxin And Related Other (See Comments)    unknown   Diltiazem Hcl     Generalized Edema (intolerance)    Metoprolol Other (See Comments)    Fluid retention   Ouabain     Generalized Edema (intolerance)    Pantoprazole Sodium Other (See Comments)    Stomach pain   Propafenone Hcl Er Other (See Comments)    H/A, N/V, stomach upset     Current Outpatient Medications  Medication Sig Dispense Refill   aspirin 81 MG EC tablet Take 1 tablet by mouth daily.     carvedilol (COREG CR) 20 MG 24 hr capsule Take 2 capsules (40 mg total) by mouth daily. 180 capsule 3   clopidogrel (PLAVIX) 75 MG tablet Take 75 mg by mouth daily.     fluticasone (FLONASE) 50 MCG/ACT nasal spray Place 2 sprays into both nostrils daily as needed. ALLERGIES     furosemide (LASIX) 40 MG tablet Take 40 mg by mouth daily as needed (swelling).      hydrALAZINE (APRESOLINE) 25 MG tablet Take 25 mg by mouth daily.     hydrochlorothiazide (HYDRODIURIL) 25 MG tablet Take 25 mg by mouth daily.     isosorbide mononitrate (IMDUR) 30 MG 24 hr tablet Take 30 mg by mouth daily.     losartan (COZAAR) 50 MG tablet Take 2 tablets by mouth daily.     nitroGLYCERIN (NITROSTAT) 0.4 MG SL tablet Place 1 tablet (0.4 mg total) under the tongue as needed. 30 tablet 3   potassium chloride SA (K-DUR,KLOR-CON) 20 MEQ tablet  Take 1 tablet by mouth daily.     RABEprazole (ACIPHEX) 20 MG tablet Take 20 mg by mouth daily.      rosuvastatin (CRESTOR) 10 MG tablet Take 10 mg by mouth daily.     No current facility-administered medications for this visit.     Past Medical History:  Diagnosis Date   Arthritis    "all" (01/22/2017)   ATRIAL FIBRILLATION 06/26/2008   Qualifier: Diagnosis of  By: Ladona Ridgel, MD, [facility], Vergia Alcon    BRADYCARDIA 06/06/2008   Qualifier: Diagnosis of  By: Flonnie Overman     CAD (coronary artery disease), native coronary artery    a. Cath 02/2018 @ [facility] - 50-70% ostial Lcx; 50% diagonal; 25% ostial LAD; 25% mid LAD; 75% dLAD; 75% dRCA >>> medical therapy   CARDIOMYOPATHY, SECONDARY 06/06/2008   Qualifier: Diagnosis of  By: Flonnie Overman     chronic diastolic CHF    Essential hypertension 06/06/2008   Qualifier: Diagnosis of  By: Flonnie Overman     GERD (gastroesophageal reflux disease)    Heart murmur    High cholesterol    Migraine    "none in the 2000s" (01/22/2017)   PONV (postoperative nausea and vomiting)    Presence of permanent  cardiac pacemaker    SINUSITIS 06/06/2008   Qualifier: Diagnosis of  By: Flonnie Overman     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   Past Surgical History:  Procedure Laterality Date   ATRIAL FIBRILLATION ABLATION     CARDIAC CATHETERIZATION     CARDIOVERSION  06/19/2005   DC cardioversion /notes 05/28/2010   INSERT / REPLACE / REMOVE PACEMAKER  01/22/2017   PACEMAKER IMPLANT N/A 01/22/2017   Procedure: PACEMAKER IMPLANT;  Surgeon: Marinus Maw, MD;  [facility];  Service: Cardiovascular;  Laterality: N/A;   SHOULDER ARTHROSCOPY WITH ROTATOR CUFF REPAIR Left ~ 2015     No family history on file.   Social History   Socioeconomic History   Marital status: Married     Spouse name: Not on file   Number of children: Not on file   Years of education: Not on file   Highest education level: Not on file  Occupational History   Not on file  Tobacco Use   Smoking status: Former    Packs/day: 0.33    Years: 7.00    Pack years: 2.31    Types: Cigarettes    Quit date: 29    Years since quitting: 47.6   Smokeless tobacco: Never  Vaping Use   Vaping Use: Never used  Substance and Sexual Activity   Alcohol use: No   Drug use: No   Sexual activity: Not Currently  Other Topics Concern   Not on file  Social History Narrative   Not on file   Social Determinants of Health   Financial Resource Strain: Not on file  Food Insecurity: Not on file  Transportation Needs: Not on file  Physical Activity: Not on file  Stress: Not on file  Social Connections: Not on file  Intimate Partner Violence: Not on file     BP (!) 146/70   Pulse 81   Ht 5\' 11"  (1.803 m)   Wt 233 lb (105.7 kg)   SpO2 98%   BMI 32.50 kg/m   Physical Exam:  Well appearing overweight 70 yo man NAD HEENT: Unremarkable Neck:  No JVD, no thyromegally Lymphatics:  No adenopathy Back:  No CVA tenderness Lungs:  Clear with no wheezes HEART:  Regular rate rhythm, no murmurs, no rubs, no clicks Abd:  soft, positive bowel sounds, no organomegally, no rebound, no guarding Ext:  2 plus pulses, no edema, no cyanosis, no clubbing Skin:  No rashes no nodules Neuro:  CN II through XII intact, motor grossly intact  EKG - atrial fib with ventricular pacing  DEVICE  Normal device function.  See PaceArt for details.   Assess/Plan:  1. Atrial fib - his rates are controlled. He is pacing about 35% of the time. He is on systemic anti-coagulation due to a h/o GI bleeding. I think that he would be a good candidate for consideration of Watchman and will refer him to Dr. 61. 2. 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  4. Diastolic heart failure - his symptoms are class 2. He will continue his current meds.   [name] [name].  I have seen [name] is  a 70 y.o. male in the office today. The patient is felt to be a poor candidate for long-term anticoagulation because of GI bleeding.  Their CHADS-2-Vasc Score and unadjusted Ischemic Stroke Rate (% per year) is equal to 4.8 % stroke rate/year from a score of 4, necessitating a strategy of stroke prevention with either long-term oral anticoagulation or left atrial appendage occlusion therapy. We have discussed their bleeding risk in the context of their comorbid medical problems, as well as the rationale for referral for evaluation of Watchman left atrial appendage occlusion therapy. While the patient is at high long-term bleeding risk, they may be appropriate for short-term anticoagulation. Based on this individual patient's stroke and bleeding risk, a shared decision has been made to refer the patient for consideration of Watchman left atrial appendage closure utilizing the [employer] shared decision tool.

## 2020-08-24 NOTE — Patient Instructions (Addendum)
Medication Instructions:  Your physician recommends that you continue on your current medications as directed. Please refer to the Current Medication list given to you today.  Labwork: None ordered.  Testing/Procedures: None ordered.  Follow-Up: Your physician wants you to follow-up in: one year with Lewayne Bunting, MD or one of the following Advanced Practice Providers on your designated Care Team:   Francis Dowse, [place] Casimiro Needle "Mardelle Matte" Lanna Poche, [place]  Remote monitoring is used to monitor your Pacemaker from home. This monitoring reduces the number of office visits required to check your device to one time per year. It allows [place] to keep an eye on the functioning of your device to ensure it is working properly. You are scheduled for a device check from home on 10/24/2020. You may send your transmission at any time that day. If you have a wireless device, the transmission will be sent automatically. After your physician reviews your transmission, you will receive a postcard with your next transmission date.  Any Other Special Instructions Will Be Listed Below (If Applicable).  If you need a refill on your cardiac medications before your next appointment, please call your pharmacy.   You will be referred to Dr. Lalla Brothers to discuss Watchman implant. October 04, 2020 at 2:45 pm

## 2020-10-04 ENCOUNTER — Ambulatory Visit: Payer: Medicare PPO | Admitting: Cardiology

## 2020-10-04 ENCOUNTER — Other Ambulatory Visit: Payer: Self-pay

## 2020-10-04 ENCOUNTER — Encounter: Payer: Self-pay | Admitting: Cardiology

## 2020-10-04 VITALS — BP 128/78 | HR 74 | Ht 71.0 in | Wt 228.0 lb

## 2020-10-04 DIAGNOSIS — I442 Atrioventricular block, complete: Secondary | ICD-10-CM

## 2020-10-04 DIAGNOSIS — I4821 Permanent atrial fibrillation: Secondary | ICD-10-CM

## 2020-10-04 DIAGNOSIS — I495 Sick sinus syndrome: Secondary | ICD-10-CM | POA: Diagnosis not present

## 2020-10-04 DIAGNOSIS — K922 Gastrointestinal hemorrhage, unspecified: Secondary | ICD-10-CM | POA: Diagnosis not present

## 2020-10-04 NOTE — Progress Notes (Signed)
Electrophysiology Office Note:    Date:  10/04/2020   ID:  [name] [name], DOB Jan 12, 1951, MRN [id number]  PCP:  Donata Duff, DO  [facility] Cardiologist:  Lewayne Bunting, MD    Referring MD: Donata Duff, DO   Chief Complaint: Atrial fibrillation  History of Present Illness:    [name] [name] is a 70 y.o. male who presents for an evaluation of atrial fibrillation at the request of Dr. Ladona Ridgel. Their medical history includes GI bleeding secondary to bleeding peptic ulcers.  He also has a medical history that includes WPW, chronic diastolic heart failure, hypertension.  The patient also has a pacemaker which was implanted for symptomatic tachybradycardia syndrome.  The patient was last seen by Dr. Ladona Ridgel on August 24, 2020.  The patient is with his wife today in clinic.    Past Medical History:  Diagnosis Date   Arthritis    "all" (01/22/2017)   ATRIAL FIBRILLATION 06/26/2008   Qualifier: Diagnosis of  By: Ladona Ridgel, MD, [facility], [facility] Alcon    BRADYCARDIA 06/06/2008   Qualifier: Diagnosis of  By: Flonnie Overman     CAD (coronary artery disease), native coronary artery    a. Cath 02/2018 @ [facility] - 50-70% ostial Lcx; 50% diagonal; 25% ostial LAD; 25% mid LAD; 75% dLAD; 75% dRCA >>> medical therapy   CARDIOMYOPATHY, SECONDARY 06/06/2008   Qualifier: Diagnosis of  By: Flonnie Overman     chronic diastolic CHF    Essential hypertension 06/06/2008   Qualifier: Diagnosis of  By: Flonnie Overman     GERD (gastroesophageal reflux disease)    Heart murmur    High cholesterol    Migraine    "none in the 2000s" (01/22/2017)   PONV (postoperative nausea and vomiting)    Presence of permanent cardiac pacemaker    SINUSITIS 06/06/2008   Qualifier: Diagnosis of  By: Flonnie Overman     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   Qualifier: Diagnosis of  By: Ladona Ridgel, MD, Jerrell Mylar     Past Surgical History:  Procedure Laterality Date   ATRIAL FIBRILLATION  ABLATION     CARDIAC CATHETERIZATION     CARDIOVERSION  06/19/2005   DC cardioversion /notes 05/28/2010   INSERT / REPLACE / REMOVE PACEMAKER  01/22/2017   PACEMAKER IMPLANT N/A 01/22/2017   Procedure: PACEMAKER IMPLANT;  Surgeon: Marinus Maw, MD;  [facility];  Service: Cardiovascular;  Laterality: N/A;   SHOULDER ARTHROSCOPY WITH ROTATOR CUFF REPAIR Left ~ 2015    Current Medications: Current Meds  Medication Sig   aspirin 81 MG EC tablet Take 1 tablet by mouth daily.   carvedilol (COREG CR) 20 MG 24 hr capsule Take 2 capsules (40 mg total) by mouth daily.   clopidogrel (PLAVIX) 75 MG tablet Take 75 mg by mouth daily.   fluticasone (FLONASE) 50 MCG/ACT nasal spray Place 2 sprays into both nostrils daily as needed. ALLERGIES   furosemide (LASIX) 40 MG tablet Take 40 mg by mouth daily as needed (swelling).    hydrALAZINE (APRESOLINE) 25 MG tablet Take 25 mg by mouth daily.   hydrochlorothiazide (HYDRODIURIL) 25 MG tablet Take 25 mg by mouth daily.   isosorbide mononitrate (IMDUR) 30 MG 24 hr tablet Take 30 mg by mouth daily.   losartan (COZAAR) 50 MG tablet Take 2 tablets by mouth daily.   nitroGLYCERIN (NITROSTAT) 0.4 MG SL tablet Place 1 tablet (0.4 mg total) under the tongue as needed.   potassium chloride SA (  K-DUR,KLOR-CON) 20 MEQ tablet Take 1 tablet by mouth daily.   RABEprazole (ACIPHEX) 20 MG tablet Take 20 mg by mouth daily.    rosuvastatin (CRESTOR) 10 MG tablet Take 10 mg by mouth daily.     Allergies:   Aspirin, Cardizem [diltiazem], Digoxin and related, Diltiazem hcl, Metoprolol, Ouabain, Pantoprazole sodium, and Propafenone hcl er   Social History   Socioeconomic History   Marital status: Married    Spouse name: Not on file   Number of children: Not on file   Years of education: Not on file   Highest education level: Not on file  Occupational History   Not on file  Tobacco Use   Smoking status: Former    Packs/day: 0.33    Years: 7.00     Pack years: 2.31    Types: Cigarettes    Quit date: 70    Years since quitting: 47.7   Smokeless tobacco: Never  Vaping Use   Vaping Use: Never used  Substance and Sexual Activity   Alcohol use: No   Drug use: No   Sexual activity: Not Currently  Other Topics Concern   Not on file  Social History Narrative   Not on file   Social Determinants of Health   Financial Resource Strain: Not on file  Food Insecurity: Not on file  Transportation Needs: Not on file  Physical Activity: Not on file  Stress: Not on file  Social Connections: Not on file     Family History: The patient's family history is not on file.  ROS:   Please see the history of present illness.    All other systems reviewed and are negative.  EKGs/Labs/Other Studies Reviewed:    The following studies were reviewed today:  Prior pacemaker interrogations for his His bundle area pacemaker system.  EKG from August 24, 2020 shows rate controlled atrial fibrillation    Recent Labs: No results found for requested labs within last 8760 hours.  Recent Lipid Panel No results found for: CHOL, TRIG, HDL, CHOLHDL, VLDL, LDLCALC, LDLDIRECT  Physical Exam:    VS:  BP 128/78   Pulse 74   Ht 5\' 11"  (1.803 m)   Wt 228 lb (103.4 kg)   SpO2 95%   BMI 31.80 kg/m     Wt Readings from Last 3 Encounters:  10/04/20 228 lb (103.4 kg)  08/24/20 233 lb (105.7 kg)  07/28/19 231 lb 12.8 oz (105.1 kg)     GEN:  Well nourished, well developed in no acute distress HEENT: Normal NECK: No JVD; No carotid bruits LYMPHATICS: No lymphadenopathy CARDIAC: Irregularly irregular, no murmurs, rubs, gallops RESPIRATORY:  Clear to auscultation without rales, wheezing or rhonchi  ABDOMEN: Soft, non-tender, non-distended MUSCULOSKELETAL:  No edema; No deformity  SKIN: Warm and dry NEUROLOGIC:  Alert and oriented x 3 PSYCHIATRIC:  Normal affect   ASSESSMENT:    1. Complete heart block (HCC)   2. Tachycardia-bradycardia  syndrome (HCC)   3. Permanent atrial fibrillation (HCC)   4. Gastrointestinal hemorrhage, unspecified gastrointestinal hemorrhage type    PLAN:    In order of problems listed above:  1. Complete heart block (HCC) Device functioning well.  2. Tachycardia-bradycardia syndrome (HCC) See #1  3. Permanent atrial fibrillation [facility]) Patient has a long history of atrial fibrillation.  He is currently not maintained on anticoagulation because of previous history of GI bleeding.  His more severe bleeding in the past was thought to be secondary to peptic ulcer disease.  We discussed  the patient's stroke risk and recommendations for long-term anticoagulation.  We discussed a left atrial appendage occlusion can be a tool to help reduce the stroke risk in patients who are not felt to be candidates for long-term anticoagulation.  I discussed the watchman procedure in detail with the patient including the risks, recovery.  He and his wife will think about the procedure and let us know if they would like to proceed.  If he does elect to proceed with watchman implant, he will need a CT for preop planning.  He will also need an updated echocardiogram.  Prior to [facility] implant, I would transition his medication regimen to Eliquis 5 mg by mouth twice daily and aspirin 81 mg by mouth daily.  We would continue this regimen until the 45-day mark post implant.  At that time we will transition to aspirin and Plavix.  -------------------------------------------------------------  I have seen Laneta Simmers Mcnealy in the office today who is being considered for a Watchman left atrial appendage closure device. I believe they will benefit from this procedure given their history of atrial fibrillation, CHA2DS2-VASc score of at least 3 and unadjusted ischemic stroke rate of 3.3% per year. Unfortunately, the patient is not felt to be a long term anticoagulation candidate secondary to previous GI bleeding. The patient's chart has  been reviewed and I feel that they would be a candidate for short term oral anticoagulation after Watchman implant.   It is my belief that after undergoing a LAA closure procedure, Landan Fedie Javier will not need long term anticoagulation which eliminates anticoagulation side effects and major bleeding risk.   Procedural risks for the Watchman implant have been reviewed with the patient including a 0.5% risk of stroke, <1% risk of perforation and <1% risk of device embolization. The risks of TEE and general anaesthesia complications were also discussed during today's appointment.   The published clinical data on the safety and effectiveness of WATCHMAN include but are not limited to the following: - Holmes DR, Everlene Farrier, Sick P et al. for the PROTECT AF Investigators. Percutaneous closure of the left atrial appendage versus warfarin therapy for prevention of stroke in patients with atrial fibrillation: a randomised non-inferiority trial. Lancet 2009; 374: 534-42. Everlene Farrier, Doshi SK, Isa Rankin D et al. on behalf of the PROTECT AF Investigators. Percutaneous Left Atrial Appendage Closure for Stroke Prophylaxis in Patients With Atrial Fibrillation 2.3-Year Follow-up of the PROTECT AF (Watchman Left Atrial Appendage System for Embolic Protection in Patients With Atrial Fibrillation) Trial. Circulation 2013; 127:720-729. - Alli O, Doshi S,  Kar S, Reddy VY, Sievert H et al. Quality of Life Assessment in the Randomized PROTECT AF (Percutaneous Closure of the Left Atrial Appendage Versus Warfarin Therapy for Prevention of Stroke in Patients With Atrial Fibrillation) Trial of Patients at Risk for Stroke With Nonvalvular Atrial Fibrillation. J Am Coll Cardiol 2013; 61:1790-8. Aline August DR, Mia [place], Price M, Whisenant B, Sievert H, Doshi S, Huber K, Reddy V. Prospective randomized evaluation of the Watchman left atrial appendage Device in patients with atrial fibrillation versus long-term warfarin therapy; the  PREVAIL trial. Journal of the [employer] of Cardiology, Vol. 4, No. 1, 2014, 1-11. - Kar S, Doshi SK, Sadhu A, Horton R, Osorio J et al. Primary outcome evaluation of a next-generation left atrial appendage closure device: results from the PINNACLE FLX trial. Circulation 2021;143(18)1754-1762.    After today's visit with the patient which was dedicated solely for shared decision making visit regarding LAA  closure device, the patient elected to wait and call us back should he wish to proceed with the LAA appendage closure procedure.    4. Gastrointestinal hemorrhage, unspecified gastrointestinal hemorrhage type See above        Total time spent with patient today 50 minutes. This includes reviewing records, evaluating the patient and coordinating care.  Medication Adjustments/Labs and Tests Ordered: Current medicines are reviewed at length with the patient today.  Concerns regarding medicines are outlined above.  No orders of the defined types were placed in this encounter.  No orders of the defined types were placed in this encounter.    Signed, Rossie Muskrat. Lalla Brothers, MD, [facility], [facility] 10/04/2020 8:32 PM    Electrophysiology  [facility]

## 2020-10-04 NOTE — Patient Instructions (Addendum)
Medication Instructions:  Your physician recommends that you continue on your current medications as directed. Please refer to the Current Medication list given to you today. *If you need a refill on your cardiac medications before your next appointment, please call your pharmacy*  Lab Work: None ordered. If you have labs (blood work) drawn today and your tests are completely normal, you will receive your results only by: MyChart Message (if you have MyChart) OR A paper copy in the mail If you have any lab test that is abnormal or we need to change your treatment, we will call you to review the results.  Testing/Procedures: None ordered.  Follow-Up: At [facility], you and your health needs are our priority.  As part of our continuing mission to provide you with exceptional heart care, we have created designated Provider Care Teams.  These Care Teams include your primary Cardiologist (physician) and Advanced Practice Providers (APPs -  Physician Assistants and Nurse Practitioners) who all work together to provide you with the care you need, when you need it.  Your next appointment:   Your physician wants you to follow-up in: as needed   

## 2020-10-09 ENCOUNTER — Telehealth: Payer: Self-pay | Admitting: Internal Medicine

## 2020-10-09 DIAGNOSIS — I4821 Permanent atrial fibrillation: Secondary | ICD-10-CM

## 2020-10-09 NOTE — Telephone Encounter (Signed)
Pt aware Boneta Lucks is not in the office today but will contact him this week about Watchman workup testing.  Pt agreeable to plan.

## 2020-10-09 NOTE — Telephone Encounter (Signed)
New message:     Patient calling to get a CT scheduled . Asking for Nurse Boneta Lucks

## 2020-10-11 NOTE — Telephone Encounter (Signed)
Pt would like to proceed with Watchman evaluation.  Will order echo and cardiac CT  Pt coming to office 10/12/20 to get lab work and go over cardiac CT instructions.  Pt advised of process going forward.  Pt thanked nurse for call back.

## 2020-10-12 ENCOUNTER — Other Ambulatory Visit: Payer: Medicare PPO | Admitting: *Deleted

## 2020-10-12 ENCOUNTER — Other Ambulatory Visit: Payer: Self-pay

## 2020-10-12 DIAGNOSIS — I4821 Permanent atrial fibrillation: Secondary | ICD-10-CM

## 2020-10-12 LAB — BASIC METABOLIC PANEL
BUN/Creatinine Ratio: 14 (ref 10–24)
BUN: 17 mg/dL (ref 8–27)
CO2: 27 mmol/L (ref 20–29)
Calcium: 9.5 mg/dL (ref 8.6–10.2)
Chloride: 103 mmol/L (ref 96–106)
Creatinine, Ser: 1.22 mg/dL (ref 0.76–1.27)
Glucose: 93 mg/dL (ref 70–99)
Potassium: 4.1 mmol/L (ref 3.5–5.2)
Sodium: 142 mmol/L (ref 134–144)
eGFR: 63 mL/min/{1.73_m2} (ref >59)

## 2020-10-16 ENCOUNTER — Telehealth (HOSPITAL_COMMUNITY): Payer: Self-pay | Admitting: Emergency Medicine

## 2020-10-16 NOTE — Telephone Encounter (Signed)
Reaching out to patient to offer assistance regarding upcoming cardiac imaging study; pt verbalizes understanding of appt date/time, parking situation and where to check in, pre-test NPO status and medications ordered, and verified current allergies; name and call back number provided for further questions should they arise [name] Alexandria RN Navigator Cardiac Imaging [facility] [phone] office [phone] cell  Daily meds, no lasix Denies iv issues

## 2020-10-18 ENCOUNTER — Ambulatory Visit (HOSPITAL_BASED_OUTPATIENT_CLINIC_OR_DEPARTMENT_OTHER): Payer: Medicare PPO

## 2020-10-18 ENCOUNTER — Other Ambulatory Visit: Payer: Self-pay

## 2020-10-18 ENCOUNTER — Ambulatory Visit (HOSPITAL_COMMUNITY)
Admission: RE | Admit: 2020-10-18 | Discharge: 2020-10-18 | Disposition: A | Discharge status: Home / Self Care | Payer: Medicare PPO | Source: Ambulatory Visit | Attending: Cardiology | Admitting: Cardiology

## 2020-10-18 DIAGNOSIS — I4821 Permanent atrial fibrillation: Secondary | ICD-10-CM | POA: Insufficient documentation

## 2020-10-18 DIAGNOSIS — Z789 Other specified health status: Secondary | ICD-10-CM | POA: Insufficient documentation

## 2020-10-18 LAB — ECHOCARDIOGRAM COMPLETE: S' Lateral: 3.3 cm

## 2020-10-18 MED ORDER — IOHEXOL 350 MG/ML SOLN
100.0000 mL | Freq: Once | INTRAVENOUS | Status: AC | PRN
  Administered 2020-10-18: 100 mL via INTRAVENOUS

## 2020-10-23 ENCOUNTER — Telehealth: Payer: Self-pay

## 2020-10-23 NOTE — Telephone Encounter (Signed)
The patient agrees to [facility] implant 11/22/2020. Arranged pre-procedure visit and Covid test Monday, 11/19/2020.  The patient understands he will be called with medication recommendations prior to that time once confirmed with Dr. Lalla Brothers.  Per Dr. Lovena Neighbours 10/04/2020 office note, "  Prior to [facility] implant, I would transition his medication regimen to Eliquis 5 mg by mouth twice daily and aspirin 81 mg by mouth daily.  We would continue this regimen until the 45-day mark post implant.  At that time we will transition to aspirin and Plavix."  To Dr. Lalla Brothers to confirm how long he would like the patient on Eliquis prior to LAAO.

## 2020-10-23 NOTE — Telephone Encounter (Signed)
New Message:     Please call, concerning scheduling the Watchman

## 2020-10-24 ENCOUNTER — Other Ambulatory Visit: Payer: Self-pay

## 2020-10-24 ENCOUNTER — Ambulatory Visit (INDEPENDENT_AMBULATORY_CARE_PROVIDER_SITE_OTHER): Payer: Medicare PPO

## 2020-10-24 DIAGNOSIS — I442 Atrioventricular block, complete: Secondary | ICD-10-CM

## 2020-10-24 DIAGNOSIS — I4821 Permanent atrial fibrillation: Secondary | ICD-10-CM

## 2020-10-24 DIAGNOSIS — K922 Gastrointestinal hemorrhage, unspecified: Secondary | ICD-10-CM

## 2020-10-24 MED ORDER — APIXABAN 5 MG PO TABS: 5.0000 mg | ORAL_TABLET | Freq: Two times a day (BID) | ORAL | 0 refills | Status: DC

## 2020-10-24 NOTE — Telephone Encounter (Signed)
Discussed with Dr. Lalla Brothers. Will STOP PLAVIX and START ELIQUIS 5 mg BID now in preparation for LAAO 11/22/2020.  Called the patient. He will STOP PLAVIX and START ELIQUIS 5 mg BID.  He is scheduled for LAAO 11/22/2020. He will come 11/19/2020 for pre-procedure appointment and Covid test. He was grateful for call and agrees with plan.

## 2020-10-25 LAB — CUP PACEART REMOTE DEVICE CHECK
Battery Remaining Longevity: 129 mo
Battery Voltage: 3.01 V
Brady Statistic AP VP Percent: 0.02 %
Brady Statistic AP VS Percent: 37.98 %
Brady Statistic AS VP Percent: 0 %
Brady Statistic AS VS Percent: 62 %
Brady Statistic RA Percent Paced: 37.37 %
Brady Statistic RV Percent Paced: 0.02 %
Date Time Interrogation Session: 20221012054240
Implantable Lead Implant Date: 20190110
Implantable Lead Implant Date: 20190110
Implantable Lead Location: 753860
Implantable Lead Location: 753860
Implantable Lead Model: 3830
Implantable Lead Model: 5076
Implantable Pulse Generator Implant Date: 20190110
Lead Channel Impedance Value: 361 Ohm
Lead Channel Impedance Value: 437 Ohm
Lead Channel Impedance Value: 494 Ohm
Lead Channel Impedance Value: 513 Ohm
Lead Channel Pacing Threshold Amplitude: 0.875 V
Lead Channel Pacing Threshold Pulse Width: 0.4 ms
Lead Channel Sensing Intrinsic Amplitude: 12.375 mV
Lead Channel Sensing Intrinsic Amplitude: 12.375 mV
Lead Channel Sensing Intrinsic Amplitude: 8.25 mV
Lead Channel Sensing Intrinsic Amplitude: 8.25 mV
Lead Channel Setting Pacing Amplitude: 2 V
Lead Channel Setting Pacing Amplitude: 2 V
Lead Channel Setting Pacing Pulse Width: 0.4 ms
Lead Channel Setting Sensing Sensitivity: 0.9 mV

## 2020-11-01 NOTE — Progress Notes (Signed)
Remote pacemaker transmission.   

## 2020-11-19 ENCOUNTER — Ambulatory Visit: Payer: Medicare PPO | Admitting: Cardiology

## 2020-11-19 ENCOUNTER — Other Ambulatory Visit (HOSPITAL_COMMUNITY)
Admission: RE | Admit: 2020-11-19 | Discharge: 2020-11-19 | Disposition: A | Discharge status: Home / Self Care | Payer: Medicare PPO | Source: Ambulatory Visit | Attending: Cardiology | Admitting: Cardiology

## 2020-11-19 ENCOUNTER — Encounter: Payer: Self-pay | Admitting: Cardiology

## 2020-11-19 ENCOUNTER — Other Ambulatory Visit: Payer: Self-pay

## 2020-11-19 VITALS — BP 136/72 | HR 75 | Ht 71.0 in | Wt 228.8 lb

## 2020-11-19 DIAGNOSIS — Z01818 Encounter for other preprocedural examination: Secondary | ICD-10-CM | POA: Diagnosis not present

## 2020-11-19 DIAGNOSIS — I482 Chronic atrial fibrillation, unspecified: Secondary | ICD-10-CM

## 2020-11-19 DIAGNOSIS — I10 Essential (primary) hypertension: Secondary | ICD-10-CM

## 2020-11-19 DIAGNOSIS — I4821 Permanent atrial fibrillation: Secondary | ICD-10-CM | POA: Diagnosis not present

## 2020-11-19 DIAGNOSIS — Z01812 Encounter for preprocedural laboratory examination: Secondary | ICD-10-CM | POA: Insufficient documentation

## 2020-11-19 DIAGNOSIS — I442 Atrioventricular block, complete: Secondary | ICD-10-CM

## 2020-11-19 DIAGNOSIS — Z20822 Contact with and (suspected) exposure to covid-19: Secondary | ICD-10-CM | POA: Insufficient documentation

## 2020-11-19 DIAGNOSIS — Z950 Presence of cardiac pacemaker: Secondary | ICD-10-CM | POA: Diagnosis not present

## 2020-11-19 DIAGNOSIS — I495 Sick sinus syndrome: Secondary | ICD-10-CM

## 2020-11-19 LAB — SARS CORONAVIRUS 2 (TAT 6-24 HRS): SARS Coronavirus 2: NEGATIVE

## 2020-11-19 NOTE — Progress Notes (Signed)
[facility]                                       Cardiology Office Note:    Date:  11/19/2020   ID:  [name], DOB 1950/08/04, MRN [id number]  PCP:  Renaldo Reel, DO  [facility] Cardiologist:  Cristopher Peru, MD/ Dr. Quentin Ore, MD  [facility] Electrophysiologist:  None   Referring MD: Renaldo Reel, DO   Chief Complaint  Patient presents with   Follow-up    Pre-Watchman    History of Present Illness:    [name] [name] is a 70 y.o. male with a hx of permanent atrial fibrillation, hx of GI bleed with bleeding peptic ulcers, hx of WPW, chronic diastolic CHF, HTN, and tachybrady syndrome s/p PPM placement. He was referred to Dr. Quentin Ore from Dr. Lovena Le for potential Watchman implantation.   On Dr. Mardene Speak evaluation 10/04/20, the patient was felt to be a good candidate for Watchman and he was referred for further imaging with CT and echocardiogram. Echo from 10/18/20 showed an LVEF at 60 to 65% with mild MR, and no AS.  Cardiac CT with anatomy suitable for watchman FL X 53mm device with 23% compression.  Medication plan prior to implant includes Eliquis 5 mg BID and ASA 81 mg daily.  Plan is to continue this regimen until the 45-day mark at which time we will perform a TEE and if adequate device seal, plan to transition aspirin and Plavix.  Today he is feeling well with no specific complaints.  He denies chest pain, palpitations, LE edema, orthopnea, shortness of breath, PND, dizziness, or syncope.  Past Medical History:  Diagnosis Date   Arthritis    "all" (01/22/2017)   ATRIAL FIBRILLATION 06/26/2008   Qualifier: Diagnosis of  By: Lovena Le, MD, [facility], Binnie Kand    BRADYCARDIA 06/06/2008   Qualifier: Diagnosis of  By: Darrick Meigs     CAD (coronary artery disease), native coronary artery    a. Cath 02/2018 @ [facility] - 50-70% ostial Lcx; 50% diagonal; 25% ostial LAD; 25% mid LAD; 75% dLAD; 75% dRCA >>> medical therapy   CARDIOMYOPATHY, SECONDARY  06/06/2008   Qualifier: Diagnosis of  By: Darrick Meigs     chronic diastolic CHF    Essential hypertension 06/06/2008   Qualifier: Diagnosis of  By: Darrick Meigs     GERD (gastroesophageal reflux disease)    Heart murmur    High cholesterol    Migraine    "none in the 2000s" (01/22/2017)   PONV (postoperative nausea and vomiting)    Presence of permanent cardiac pacemaker    SINUSITIS 06/06/2008   Qualifier: Diagnosis of  By: Darrick Meigs     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   Qualifier: Diagnosis of  By: Lovena Le, MD, Martyn Malay     Past Surgical History:  Procedure Laterality Date   ATRIAL FIBRILLATION ABLATION     CARDIAC CATHETERIZATION     CARDIOVERSION  06/19/2005   DC cardioversion /notes 05/28/2010   INSERT / REPLACE / REMOVE PACEMAKER  01/22/2017   PACEMAKER IMPLANT N/A 01/22/2017   Procedure: PACEMAKER IMPLANT;  Surgeon: Evans Lance, MD;  Location: [facility];  Service: Cardiovascular;  Laterality: N/A;   SHOULDER ARTHROSCOPY WITH ROTATOR CUFF REPAIR Left ~ 2015    Current Medications: Current Meds  Medication Sig   apixaban (ELIQUIS)  5 MG TABS tablet Take 1 tablet (5 mg total) by mouth 2 (two) times daily.   aspirin 81 MG EC tablet Take 81 mg by mouth daily.   carvedilol (COREG CR) 20 MG 24 hr capsule Take 2 capsules (40 mg total) by mouth daily.   fluticasone (FLONASE) 50 MCG/ACT nasal spray Place 2 sprays into both nostrils daily as needed. ALLERGIES   furosemide (LASIX) 40 MG tablet Take 40 mg by mouth daily as needed (swelling).    hydrALAZINE (APRESOLINE) 25 MG tablet Take 25 mg by mouth daily.   hydrochlorothiazide (HYDRODIURIL) 25 MG tablet Take 25 mg by mouth daily.   isosorbide mononitrate (IMDUR) 30 MG 24 hr tablet Take 30 mg by mouth daily.   losartan (COZAAR) 50 MG tablet Take 100 mg by mouth daily.   nitroGLYCERIN (NITROSTAT) 0.4 MG SL tablet Place 1 tablet (0.4 mg total) under the tongue as needed.    potassium chloride SA (K-DUR,KLOR-CON) 20 MEQ tablet Take 20 mEq by mouth daily.   RABEprazole (ACIPHEX) 20 MG tablet Take 20 mg by mouth daily.    rosuvastatin (CRESTOR) 10 MG tablet Take 10 mg by mouth daily.     Allergies:   Digoxin and related, Diltiazem hcl, Metoprolol, Ouabain, Pantoprazole sodium, and Propafenone hcl er   Social History   Socioeconomic History   Marital status: Married    Spouse name: Not on file   Number of children: Not on file   Years of education: Not on file   Highest education level: Not on file  Occupational History   Not on file  Tobacco Use   Smoking status: Former    Packs/day: 0.33    Years: 7.00    Pack years: 2.31    Types: Cigarettes    Quit date: 48    Years since quitting: 47.8   Smokeless tobacco: Never  Vaping Use   Vaping Use: Never used  Substance and Sexual Activity   Alcohol use: No   Drug use: No   Sexual activity: Not Currently  Other Topics Concern   Not on file  Social History Narrative   Not on file   Social Determinants of Health   Financial Resource Strain: Not on file  Food Insecurity: Not on file  Transportation Needs: Not on file  Physical Activity: Not on file  Stress: Not on file  Social Connections: Not on file     Family History: The patient's family history is not on file.  ROS:   Please see the history of present illness.    All other systems reviewed and are negative.  EKGs/Labs/Other Studies Reviewed:    The following studies were reviewed today:  CTA 10/18/20:  ADDENDUM: Left Atrial Appendage:   Morphology: The left atrial appendage has one dominant lobe.   Thrombus: There is a filling defect that resolves on the delayed sequence; this is not consistent with thrombus in the left atrial appendage.   The following measurements were made regarding left atrial appendage closure:   Phase assessed: 35 %   Landing Zone measurement: 27 mm   LAA Length (maximum): 28 mm   Optimal  interatrial septum puncture site: a mid-inferior, posterior approach is recommended (see DICOM images)   Optimal deployment angle: RAO 23 CRA 20   Catheter: A double curve catheter is recommended.   Watchman FLX Device: A 35 mm device is recommended with 23% compression.   Other comments: None.  Echocardiogram 10/18/20:   1. Left ventricular ejection fraction, by  estimation, is 60 to 65%. The  left ventricle has normal function. The left ventricle has no regional  wall motion abnormalities. Left ventricular diastolic function could not  be evaluated.   2. Right ventricular systolic function is normal. The right ventricular  size is normal. There is normal pulmonary artery systolic pressure. The  estimated right ventricular systolic pressure is [id number] mmHg.   3. Left atrial size was mildly dilated.   4. The mitral valve is degenerative. Mild mitral valve regurgitation. No  evidence of mitral stenosis. Moderate to severe mitral annular  calcification.   5. The aortic valve is normal in structure. Aortic valve regurgitation is  not visualized. No aortic stenosis is present.   6. The inferior vena cava is normal in size with greater than 50%  respiratory variability, suggesting right atrial pressure of 3 mmHg.   Comparison(s): 07/17/16 EF 50-55%. PA pressure 18mmHg.    EKG:  EKG is ordered today.  The ekg ordered today demonstrates atrial fibrillation with HR 75bpm.   Recent Labs: 10/12/2020: BUN 17; Creatinine, Ser 1.22; Potassium 4.1; Sodium 142  Recent Lipid Panel No results found for: CHOL, TRIG, HDL, CHOLHDL, VLDL, LDLCALC, LDLDIRECT  Risk Assessment/Calculations:    CHA2DS2-VASc Score = 3  This indicates a 3.2% annual risk of stroke. The patient's score is based upon: CHF History: 0 HTN History: 1 Diabetes History: 0 Stroke History: 0 Vascular Disease History: 1 Age Score: 1 Gender Score: 0    HAS-BLED score  Hypertension Yes  Abnormal renal and liver function  (Dialysis, transplant, Cr >2.26 mg/dL /Cirrhosis or Bilirubin >2x Normal or AST/ALT/AP >3x Normal) No  Stroke No  Bleeding Yes  Labile INR (Unstable/high INR) No  Elderly (>65) Yes  Drugs or alcohol (? 8 drinks/week, anti-plt or NSAID) No    Physical Exam:    VS:  BP 136/72   Pulse 75   Ht 5\' 11"  (1.803 m)   Wt 228 lb 12.8 oz (103.8 kg)   SpO2 96%   BMI 31.91 kg/m     Wt Readings from Last 3 Encounters:  11/19/20 228 lb 12.8 oz (103.8 kg)  10/04/20 228 lb (103.4 kg)  08/24/20 233 lb (105.7 kg)    General: Well developed, well nourished, NAD Lungs:Clear to ausculation bilaterally. No wheezes, rales, or rhonchi. Breathing is unlabored. Cardiovascular: Irregularly irregular. No murmurs Extremities: No edema.  Neuro: Alert and oriented. No focal deficits. No facial asymmetry. MAE spontaneously. Psych: Responds to questions appropriately with normal affect.    ASSESSMENT/PLAN:    1. Permanent atrial fibrillation: w/ hx of GI bleed secondary to peptic ulcer disease felt to be a good candidate for Watchman implantation. Underwent CT and echo imaging with anatomy suitable for device implant. Medication plan was for Eliquis and ASA until after 45-day TEE performed. If  adequate seal, plan for transition aspirin and Plavix. Obtain BMET and CBC today. He will go for COVID screen after visit today. SBE for 6 months post procedure discussed. Will Rx at post op follow up   2. HTN: Stable with no changes  3. Complete heart block: PPM in place>>follows with Dr. Lovena Le  4. Tachycardia/bradycardia syndrome: PPM as above   Medication Adjustments/Labs and Tests Ordered: Current medicines are reviewed at length with the patient today.  Concerns regarding medicines are outlined above.  Orders Placed This Encounter  Procedures   Basic metabolic panel   CBC   EKG 12-Lead   No orders of the defined types were placed in this  encounter.   Patient Instructions  Medication Instructions:   *If  you need a refill on your cardiac medications before your next appointment, please call your pharmacy*  Lab Work: Your physician recommends that you have lab work today. BMET and CBC  If you have labs (blood work) drawn today and your tests are completely normal, you will receive your results only by: [place] (if you have MyChart) OR A paper copy in the mail If you have any lab test that is abnormal or we need to change your treatment, we will call you to review the results.  Follow-Up: At [facility], you and your health needs are our priority.  As part of our continuing mission to provide you with exceptional heart care, we have created designated Provider Care Teams.  These Care Teams include your primary Cardiologist (physician) and Advanced Practice Providers (APPs -  Physician Assistants and Nurse Practitioners) who all work together to provide you with the care you need, when you need it.  We recommend signing up for the patient portal called "MyChart".  Sign up information is provided on this After Visit Summary.  MyChart is used to connect with patients for Virtual Visits (Telemedicine).  Patients are able to view lab/test results, encounter notes, upcoming appointments, etc.  Non-urgent messages can be sent to your provider as well.   To learn more about what you can do with MyChart, go to [email or web address].    Your next appointment:   We will call you to schedule your follow up.   Signed, Kathyrn Drown, NP  11/19/2020 1:34 PM    [facility]

## 2020-11-19 NOTE — Patient Instructions (Signed)
Medication Instructions:   *If you need a refill on your cardiac medications before your next appointment, please call your pharmacy*  Lab Work: Your physician recommends that you have lab work today. BMET and CBC  If you have labs (blood work) drawn today and your tests are completely normal, you will receive your results only by: MyChart Message (if you have MyChart) OR A paper copy in the mail If you have any lab test that is abnormal or we need to change your treatment, we will call you to review the results.  Follow-Up: At [facility], you and your health needs are our priority.  As part of our continuing mission to provide you with exceptional heart care, we have created designated Provider Care Teams.  These Care Teams include your primary Cardiologist (physician) and Advanced Practice Providers (APPs -  Physician Assistants and Nurse Practitioners) who all work together to provide you with the care you need, when you need it.  We recommend signing up for the patient portal called "MyChart".  Sign up information is provided on this After Visit Summary.  MyChart is used to connect with patients for Virtual Visits (Telemedicine).  Patients are able to view lab/test results, encounter notes, upcoming appointments, etc.  Non-urgent messages can be sent to your provider as well.   To learn more about what you can do with MyChart, go to [email or web address].    Your next appointment:   We will call you to schedule your follow up.

## 2020-11-20 ENCOUNTER — Encounter: Payer: Self-pay | Admitting: Internal Medicine

## 2020-11-20 LAB — BASIC METABOLIC PANEL
BUN/Creatinine Ratio: 11 (ref 10–24)
BUN: 14 mg/dL (ref 8–27)
CO2: 27 mmol/L (ref 20–29)
Calcium: 9.5 mg/dL (ref 8.6–10.2)
Chloride: 105 mmol/L (ref 96–106)
Creatinine, Ser: 1.31 mg/dL — ABNORMAL HIGH (ref 0.76–1.27)
Glucose: 85 mg/dL (ref 70–99)
Potassium: 4.3 mmol/L (ref 3.5–5.2)
Sodium: 142 mmol/L (ref 134–144)
eGFR: 58 mL/min/{1.73_m2} — ABNORMAL LOW (ref >59)

## 2020-11-20 LAB — CBC
Hematocrit: 42.4 % (ref 37.5–51.0)
Hemoglobin: 14.1 g/dL (ref 13.0–17.7)
MCH: 30.2 pg (ref 26.6–33.0)
MCHC: 33.3 g/dL (ref 31.5–35.7)
MCV: 91 fL (ref 79–97)
Platelets: 215 10*3/uL (ref 150–450)
RBC: 4.67 x10E6/uL (ref 4.14–5.80)
RDW: 14 % (ref 11.6–15.4)
WBC: 5.3 10*3/uL (ref 3.4–10.8)

## 2020-11-20 NOTE — Progress Notes (Signed)
PERIOPERATIVE PRESCRIPTION FOR IMPLANTED CARDIAC DEVICE PROGRAMMING  Patient Information: Name:  [name] [name]  DOB:  1950/10/04  MRN:  [id number]    Tomi Likens, RN  [facility] Device Planned Procedure:  Watchman  Date of Procedure:  11/22/20  Cautery will be used.  Position during surgery:  Dr. Lalla Brothers   Please send documentation back to:  [facility] (Fax # [phone])   Estella Husk, RN  11/20/2020 3:56 PM  Device Information:  Clinic EP Physician:  Lewayne Bunting, MD   Device Type:  Pacemaker Manufacturer and Phone #:  Medtronic: [phone] Pacemaker Dependent?:  No. Date of Last Device Check:  10/24/20  Normal Device Function?:  Yes.    Electrophysiologist's Recommendations:  Have magnet available. Provide continuous ECG monitoring when magnet is used or reprogramming is to be performed.  Procedure will likely interfere with device function.  Device should be programmed:  Asynchronous pacing during procedure and returned to normal programming after procedure  Per Device Clinic Standing Orders, Linton Ham, RN  4:03 PM 11/20/2020

## 2020-11-22 ENCOUNTER — Inpatient Hospital Stay (HOSPITAL_COMMUNITY): Payer: Medicare PPO | Admitting: Anesthesiology

## 2020-11-22 ENCOUNTER — Inpatient Hospital Stay (HOSPITAL_COMMUNITY): Payer: Medicare PPO

## 2020-11-22 ENCOUNTER — Other Ambulatory Visit: Payer: Self-pay

## 2020-11-22 ENCOUNTER — Encounter (HOSPITAL_COMMUNITY): Payer: Self-pay | Admitting: Cardiology

## 2020-11-22 ENCOUNTER — Inpatient Hospital Stay (HOSPITAL_COMMUNITY)
Admission: RE | Admit: 2020-11-22 | Discharge: 2020-11-23 | DRG: 274 | Disposition: A | Discharge status: Home / Self Care | Payer: Medicare PPO | Attending: Cardiology | Admitting: Cardiology

## 2020-11-22 ENCOUNTER — Encounter (HOSPITAL_COMMUNITY)
Admission: RE | Disposition: A | Discharge status: Home / Self Care | Payer: Self-pay | Source: Home / Self Care | Attending: Cardiology

## 2020-11-22 DIAGNOSIS — I110 Hypertensive heart disease with heart failure: Secondary | ICD-10-CM | POA: Diagnosis present

## 2020-11-22 DIAGNOSIS — I4821 Permanent atrial fibrillation: Secondary | ICD-10-CM

## 2020-11-22 DIAGNOSIS — Z888 Allergy status to other drugs, medicaments and biological substances status: Secondary | ICD-10-CM | POA: Diagnosis not present

## 2020-11-22 DIAGNOSIS — I2510 Atherosclerotic heart disease of native coronary artery without angina pectoris: Secondary | ICD-10-CM | POA: Diagnosis not present

## 2020-11-22 DIAGNOSIS — I456 Pre-excitation syndrome: Secondary | ICD-10-CM | POA: Diagnosis present

## 2020-11-22 DIAGNOSIS — I495 Sick sinus syndrome: Secondary | ICD-10-CM | POA: Diagnosis not present

## 2020-11-22 DIAGNOSIS — E7800 Pure hypercholesterolemia, unspecified: Secondary | ICD-10-CM | POA: Diagnosis not present

## 2020-11-22 DIAGNOSIS — K922 Gastrointestinal hemorrhage, unspecified: Secondary | ICD-10-CM

## 2020-11-22 DIAGNOSIS — Z20822 Contact with and (suspected) exposure to covid-19: Secondary | ICD-10-CM | POA: Diagnosis present

## 2020-11-22 DIAGNOSIS — Z95818 Presence of other cardiac implants and grafts: Secondary | ICD-10-CM

## 2020-11-22 DIAGNOSIS — I4891 Unspecified atrial fibrillation: Secondary | ICD-10-CM | POA: Diagnosis present

## 2020-11-22 DIAGNOSIS — I5032 Chronic diastolic (congestive) heart failure: Secondary | ICD-10-CM | POA: Diagnosis present

## 2020-11-22 DIAGNOSIS — Z950 Presence of cardiac pacemaker: Secondary | ICD-10-CM | POA: Diagnosis not present

## 2020-11-22 DIAGNOSIS — Z87891 Personal history of nicotine dependence: Secondary | ICD-10-CM | POA: Diagnosis not present

## 2020-11-22 DIAGNOSIS — Z7982 Long term (current) use of aspirin: Secondary | ICD-10-CM | POA: Diagnosis not present

## 2020-11-22 DIAGNOSIS — Z79899 Other long term (current) drug therapy: Secondary | ICD-10-CM

## 2020-11-22 DIAGNOSIS — Z7902 Long term (current) use of antithrombotics/antiplatelets: Secondary | ICD-10-CM

## 2020-11-22 DIAGNOSIS — Z006 Encounter for examination for normal comparison and control in clinical research program: Secondary | ICD-10-CM

## 2020-11-22 DIAGNOSIS — Z01818 Encounter for other preprocedural examination: Secondary | ICD-10-CM

## 2020-11-22 DIAGNOSIS — I442 Atrioventricular block, complete: Secondary | ICD-10-CM | POA: Diagnosis present

## 2020-11-22 HISTORY — DX: Presence of other cardiac implants and grafts: Z95.818

## 2020-11-22 HISTORY — PX: LEFT ATRIAL APPENDAGE OCCLUSION: EP1229

## 2020-11-22 HISTORY — PX: TEE WITHOUT CARDIOVERSION: SHX5443

## 2020-11-22 LAB — SURGICAL PCR SCREEN
MRSA, PCR: NEGATIVE
Staphylococcus aureus: NEGATIVE

## 2020-11-22 LAB — TYPE AND SCREEN
ABO/RH(D): O POS
Antibody Screen: NEGATIVE

## 2020-11-22 LAB — ABO/RH: ABO/RH(D): O POS

## 2020-11-22 LAB — POCT ACTIVATED CLOTTING TIME: Activated Clotting Time: 341 seconds

## 2020-11-22 SURGERY — LEFT ATRIAL APPENDAGE OCCLUSION
Anesthesia: General

## 2020-11-22 MED ORDER — ONDANSETRON HCL 4 MG/2ML IJ SOLN: 4.0000 mg | Freq: Four times a day (QID) | INTRAMUSCULAR | Status: DC | PRN

## 2020-11-22 MED ORDER — ONDANSETRON HCL 4 MG/2ML IJ SOLN
INTRAMUSCULAR | Status: DC | PRN
  Administered 2020-11-22: 4 mg via INTRAVENOUS

## 2020-11-22 MED ORDER — ASPIRIN 81 MG PO CHEW
81.0000 mg | CHEWABLE_TABLET | Freq: Every day | ORAL | Status: DC
  Administered 2020-11-23: 81 mg via ORAL
  Filled 2020-11-22: qty 1

## 2020-11-22 MED ORDER — APIXABAN 5 MG PO TABS: 5.0000 mg | ORAL_TABLET | Freq: Two times a day (BID) | ORAL | Status: DC

## 2020-11-22 MED ORDER — ASPIRIN 81 MG PO TBEC: 81.0000 mg | DELAYED_RELEASE_TABLET | Freq: Every day | ORAL | Status: DC

## 2020-11-22 MED ORDER — CHLORHEXIDINE GLUCONATE 0.12 % MT SOLN
15.0000 mL | OROMUCOSAL | Status: AC
  Administered 2020-11-22: 15 mL via OROMUCOSAL
  Filled 2020-11-22 (×2): qty 15

## 2020-11-22 MED ORDER — PHENYLEPHRINE HCL-NACL 20-0.9 MG/250ML-% IV SOLN
INTRAVENOUS | Status: DC | PRN
  Administered 2020-11-22: 20 ug/min via INTRAVENOUS

## 2020-11-22 MED ORDER — PROPOFOL 10 MG/ML IV BOLUS
INTRAVENOUS | Status: DC | PRN
  Administered 2020-11-22: 40 mg via INTRAVENOUS
  Administered 2020-11-22: 160 mg via INTRAVENOUS

## 2020-11-22 MED ORDER — SODIUM CHLORIDE 0.9 % IV SOLN: INTRAVENOUS | Status: DC

## 2020-11-22 MED ORDER — PROPOFOL 500 MG/50ML IV EMUL
INTRAVENOUS | Status: DC | PRN
  Administered 2020-11-22: 75 ug/kg/min via INTRAVENOUS

## 2020-11-22 MED ORDER — HEPARIN (PORCINE) IN NACL 2000-0.9 UNIT/L-% IV SOLN
INTRAVENOUS | Status: AC
  Filled 2020-11-22: qty 1000

## 2020-11-22 MED ORDER — SODIUM CHLORIDE 0.9 % IV SOLN: 250.0000 mL | INTRAVENOUS | Status: DC | PRN

## 2020-11-22 MED ORDER — HEPARIN SODIUM (PORCINE) 1000 UNIT/ML IJ SOLN
INTRAMUSCULAR | Status: DC | PRN
  Administered 2020-11-22: 15000 [IU] via INTRAVENOUS

## 2020-11-22 MED ORDER — CARVEDILOL PHOSPHATE ER 20 MG PO CP24
40.0000 mg | ORAL_CAPSULE | Freq: Every day | ORAL | Status: DC
  Administered 2020-11-23: 40 mg via ORAL
  Filled 2020-11-22: qty 2
  Filled 2020-11-22: qty 1

## 2020-11-22 MED ORDER — SODIUM CHLORIDE 0.9% FLUSH
3.0000 mL | Freq: Two times a day (BID) | INTRAVENOUS | Status: DC
  Administered 2020-11-22: 3 mL via INTRAVENOUS

## 2020-11-22 MED ORDER — LOSARTAN POTASSIUM 50 MG PO TABS
100.0000 mg | ORAL_TABLET | Freq: Every day | ORAL | Status: DC
  Administered 2020-11-23: 100 mg via ORAL
  Filled 2020-11-22: qty 2

## 2020-11-22 MED ORDER — DEXAMETHASONE SODIUM PHOSPHATE 10 MG/ML IJ SOLN
INTRAMUSCULAR | Status: DC | PRN
  Administered 2020-11-22: 5 mg via INTRAVENOUS

## 2020-11-22 MED ORDER — LIDOCAINE 2% (20 MG/ML) 5 ML SYRINGE
INTRAMUSCULAR | Status: DC | PRN
  Administered 2020-11-22: 60 mg via INTRAVENOUS

## 2020-11-22 MED ORDER — IOHEXOL 350 MG/ML SOLN
INTRAVENOUS | Status: DC | PRN
  Administered 2020-11-22: 30 mL via INTRA_ARTERIAL

## 2020-11-22 MED ORDER — PROTAMINE SULFATE 10 MG/ML IV SOLN
INTRAVENOUS | Status: DC | PRN
  Administered 2020-11-22: 10 mg via INTRAVENOUS
  Administered 2020-11-22: 5 mg via INTRAVENOUS
  Administered 2020-11-22: 10 mg via INTRAVENOUS
  Administered 2020-11-22: 5 mg via INTRAVENOUS

## 2020-11-22 MED ORDER — HEPARIN (PORCINE) IN NACL 2000-0.9 UNIT/L-% IV SOLN
INTRAVENOUS | Status: DC | PRN
  Administered 2020-11-22: 1000 mL

## 2020-11-22 MED ORDER — ROCURONIUM BROMIDE 10 MG/ML (PF) SYRINGE
PREFILLED_SYRINGE | INTRAVENOUS | Status: DC | PRN
  Administered 2020-11-22: 80 mg via INTRAVENOUS

## 2020-11-22 MED ORDER — ROSUVASTATIN CALCIUM 5 MG PO TABS
10.0000 mg | ORAL_TABLET | Freq: Every day | ORAL | Status: DC
  Filled 2020-11-22: qty 2

## 2020-11-22 MED ORDER — ACETAMINOPHEN 325 MG PO TABS: 650.0000 mg | ORAL_TABLET | ORAL | Status: DC | PRN

## 2020-11-22 MED ORDER — LACTATED RINGERS IV SOLN: INTRAVENOUS | Status: DC

## 2020-11-22 MED ORDER — CEFAZOLIN SODIUM-DEXTROSE 2-4 GM/100ML-% IV SOLN
2.0000 g | INTRAVENOUS | Status: AC
  Administered 2020-11-22: 2 g via INTRAVENOUS
  Filled 2020-11-22: qty 100

## 2020-11-22 MED ORDER — HYDROCHLOROTHIAZIDE 25 MG PO TABS
25.0000 mg | ORAL_TABLET | Freq: Every day | ORAL | Status: DC
  Administered 2020-11-23: 25 mg via ORAL
  Filled 2020-11-22: qty 1

## 2020-11-22 MED ORDER — SODIUM CHLORIDE 0.9% FLUSH: 3.0000 mL | INTRAVENOUS | Status: DC | PRN

## 2020-11-22 MED ORDER — HEPARIN (PORCINE) IN NACL 1000-0.9 UT/500ML-% IV SOLN
INTRAVENOUS | Status: DC | PRN
  Administered 2020-11-22: 500 mL

## 2020-11-22 MED ORDER — HEPARIN (PORCINE) IN NACL 1000-0.9 UT/500ML-% IV SOLN
INTRAVENOUS | Status: AC
  Filled 2020-11-22: qty 500

## 2020-11-22 MED ORDER — SUGAMMADEX SODIUM 200 MG/2ML IV SOLN
INTRAVENOUS | Status: DC | PRN
  Administered 2020-11-22: 200 mg via INTRAVENOUS

## 2020-11-22 MED ORDER — HYDRALAZINE HCL 25 MG PO TABS
25.0000 mg | ORAL_TABLET | Freq: Every day | ORAL | Status: DC
  Administered 2020-11-23: 25 mg via ORAL
  Filled 2020-11-22: qty 1

## 2020-11-22 MED ORDER — APIXABAN 5 MG PO TABS
5.0000 mg | ORAL_TABLET | Freq: Two times a day (BID) | ORAL | Status: DC
  Administered 2020-11-22 – 2020-11-23 (×2): 5 mg via ORAL
  Filled 2020-11-22 (×2): qty 1

## 2020-11-22 SURGICAL SUPPLY — 20 items
CATH DIAG 6FR PIGTAIL ANGLED (CATHETERS) ×1 IMPLANT
CLOSURE PERCLOSE PROSTYLE (VASCULAR PRODUCTS) ×2 IMPLANT
DEVICE WATCHMAN FLX PROC (KITS) IMPLANT
DILATOR VESSEL 38 20CM 11FR (INTRODUCER) ×1 IMPLANT
DILATOR VESSEL 38 20CM 16FR (INTRODUCER) ×1 IMPLANT
KIT HEART LEFT (KITS) ×3 IMPLANT
KIT VERSACROSS LRG ACCESS (CATHETERS) ×1 IMPLANT
PACK CARDIAC CATHETERIZATION (CUSTOM PROCEDURE TRAY) ×2 IMPLANT
PAD PRO RADIOLUCENT 2001M-C (PAD) ×2 IMPLANT
SHEATH PERFORMER 16FR 30 (SHEATH) ×1 IMPLANT
SHEATH PINNACLE 8F 10CM (SHEATH) ×1 IMPLANT
SHEATH PROBE COVER 6X72 (BAG) ×3 IMPLANT
SHIELD RADPAD SCOOP 12X17 (MISCELLANEOUS) ×2 IMPLANT
SYS WATCHMAN FXD DBL (SHEATH) ×2
SYSTEM WATCHMAN FXD DBL (SHEATH) IMPLANT
TRANSDUCER W/STOPCOCK (MISCELLANEOUS) ×2 IMPLANT
TUBING CIL FLEX 10 FLL-RA (TUBING) ×3 IMPLANT
WATCHMAN FLX 27 (Prosthesis & Implant Heart) ×1 IMPLANT
WATCHMAN FLX PROCEDURE DEVICE (KITS) ×2 IMPLANT
WATCHMAN PROCED TRUSEAL ACCESS (SHEATH) ×1 IMPLANT

## 2020-11-22 NOTE — Transfer of Care (Signed)
Immediate Anesthesia Transfer of Care Note  Patient: Konrad Hoak Degollado  Procedure(s) Performed: LEFT ATRIAL APPENDAGE OCCLUSION TRANSESOPHAGEAL ECHOCARDIOGRAM (TEE)  Patient Location: PACU  Anesthesia Type:General  Level of Consciousness: awake and alert   Airway & Oxygen Therapy: Patient Spontanous Breathing and Patient connected to nasal cannula oxygen  Post-op Assessment: Report given to RN and Post -op Vital signs reviewed and stable  Post vital signs: Reviewed and stable  Last Vitals:  Vitals Value Taken Time  BP 127/75 11/22/20 1323  Temp 36.2 C 11/22/20 1257  Pulse 66 11/22/20 1326  Resp 13 11/22/20 1326  SpO2 100 % 11/22/20 1326  Vitals shown include unvalidated device data.  Last Pain:  Vitals:   11/22/20 1301  TempSrc:   PainSc: 5       Patients Stated Pain Goal: 0 (11/22/20 1301)  Complications: No notable events documented.

## 2020-11-22 NOTE — Op Note (Signed)
  [facility]   MULTIDISCIPLINARY HEART TEAM  Watchman LAAO Procedure   Surgeon: Steffanie Dunn, MD Co-Surgeon: Tonny Bollman, MD Anesthesia: Corky Sox, MD Imager: Riley Lam, MD and Charlton Haws, MD   Procedure: 1) Transseptal Puncture 2) Watchman left atrial appendage occlusion 3) Perclose right femoral vein   Device Implant: 27 mm Watchman Flex Device   Background and Indication: 70 yo with permanent atrial fibrillation, prior GI bleeding, felt to be a poor candidate for long term anticoagulation. He is referred for the above procedure   Procedure description: US guidance is used for right femoral venous access. [place] images are stored digitally in the patient's chart. Double Preclose is performed at 10" and 2" positions using normal technique and a after progressively dilating the femoral vein over an Amplatz Superstiff wire, a 16 Fr sheath is inserted. Transseptal puncture is then performed after fully anticoagulating the patient with unfractionated heparin. A therapeutic ACT greater than 250 seconds is achieved. A Versacross Connect system is used with RF energy to cross the interatrial septum and deliver the 14 Fr Watchman access sheath under fluoroscopic and TEE guidance.   Once the 14 Fr access sheath is in appropriate position in the left atrial appendage, a 27 mm Watchman Flex device is inserted and deployed, demonstrating appropriate position and compression in the left atrial appendage. A single recapture was necessary to reposition the device more proximally for complete closure of the left atrial appendage.  On initial imaging there was an uncovered proximal lobe.  After the recapture and redeployment, this lobe was fully covered.  This was confirmed by TEE and fluoroscopic evaluation.  Thorough TEE imaging is performed to evaluate all PASS criteria prior to device release. After device release, the device remains in stable position with complete  occlusion of the appendage. The sheath is removed from the body and the previously deployed perclose sutures are tightened for complete hemostasis.   Conclusion: Successful implantation of a 27 mm Watchman FLX left atrial appendage occlusion device using fluoroscopic and transesophageal echo guidance  Tonny Bollman 11/22/2020 12:32 PM

## 2020-11-22 NOTE — Progress Notes (Signed)
  [facility]    Patient doing well s/p Watchman implant. He is hemodynamically stable. Groin site is stable however does have a small ooze with no s/s of hematoma. Plan for early ambulation after bedrest completed and hopeful discharge over the next 24-48 hours.   Georgie Chard NP-C Structural Heart Team  Pager: [phone]

## 2020-11-22 NOTE — Anesthesia Procedure Notes (Signed)
Procedure Name: Intubation Date/Time: 11/22/2020 11:23 AM Performed by: Lynnell Chad, CRNA Pre-anesthesia Checklist: Patient identified, Emergency Drugs available, Suction available and Patient being monitored Patient Re-evaluated:Patient Re-evaluated prior to induction Oxygen Delivery Method: Circle System Utilized Preoxygenation: Pre-oxygenation with 100% oxygen Induction Type: IV induction Ventilation: Mask ventilation without difficulty Laryngoscope Size: Miller and 3 Grade View: Grade I Tube type: Oral Tube size: 7.5 mm Number of attempts: 1 Airway Equipment and Method: Stylet and Oral airway Placement Confirmation: ETT inserted through vocal cords under direct vision, positive ETCO2 and breath sounds checked- equal and bilateral Secured at: 23 cm Tube secured with: Tape Dental Injury: Teeth and Oropharynx as per pre-operative assessment

## 2020-11-22 NOTE — Progress Notes (Signed)
Echocardiogram Echocardiogram Transesophageal has been performed.  [name] [name] RDCS 11/22/2020, 1:15 PM

## 2020-11-22 NOTE — H&P (Signed)
Electrophysiology Office Note:     Date:  10/04/2020    ID:  [name] [name], DOB [birth year], MRN [id number]   PCP:  Renaldo Reel, DO            [facility] Cardiologist:  Cristopher Peru, MD      Referring MD: Renaldo Reel, DO    Chief Complaint: Atrial fibrillation   History of Present Illness:     [name] [name] is a 70 y.o. male who presents for an evaluation of atrial fibrillation at the request of Dr. Lovena Le. Their medical history includes GI bleeding secondary to bleeding peptic ulcers.  He also has a medical history that includes WPW, chronic diastolic heart failure, hypertension.  The patient also has a pacemaker which was implanted for symptomatic tachybradycardia syndrome.   The patient was last seen by Dr. Lovena Le on August 24, 2020.  The patient is with his wife today in clinic.          Past Medical History:  Diagnosis Date   Arthritis      "all" (01/22/2017)   ATRIAL FIBRILLATION 06/26/2008    Qualifier: Diagnosis of  By: Lovena Le, MD, [facility], Binnie Kand    BRADYCARDIA 06/06/2008    Qualifier: Diagnosis of  By: Darrick Meigs     CAD (coronary artery disease), native coronary artery      a. Cath 02/2018 @ [facility] - 50-70% ostial Lcx; 50% diagonal; 25% ostial LAD; 25% mid LAD; 75% dLAD; 75% dRCA >>> medical therapy   CARDIOMYOPATHY, SECONDARY 06/06/2008    Qualifier: Diagnosis of  By: Darrick Meigs     chronic diastolic CHF     Essential hypertension 06/06/2008    Qualifier: Diagnosis of  By: Darrick Meigs     GERD (gastroesophageal reflux disease)     Heart murmur     High cholesterol     Migraine      "none in the 2000s" (01/22/2017)   PONV (postoperative nausea and vomiting)     Presence of permanent cardiac pacemaker     SINUSITIS 06/06/2008    Qualifier: Diagnosis of  By: Darrick Meigs     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    Qualifier: Diagnosis of  By: Lovena Le, MD, Martyn Malay            Past Surgical History:   Procedure Laterality Date   ATRIAL FIBRILLATION ABLATION       CARDIAC CATHETERIZATION       CARDIOVERSION   06/19/2005    DC cardioversion /notes 05/28/2010   INSERT / REPLACE / REMOVE PACEMAKER   01/22/2017   PACEMAKER IMPLANT N/A 01/22/2017    Procedure: PACEMAKER IMPLANT;  Surgeon: Evans Lance, MD;  Location: [facility];  Service: Cardiovascular;  Laterality: N/A;   SHOULDER ARTHROSCOPY WITH ROTATOR CUFF REPAIR Left ~ 2015      Current Medications: Active Medications      Current Meds  Medication Sig   aspirin 81 MG EC tablet Take 1 tablet by mouth daily.   carvedilol (COREG CR) 20 MG 24 hr capsule Take 2 capsules (40 mg total) by mouth daily.   clopidogrel (PLAVIX) 75 MG tablet Take 75 mg by mouth daily.   fluticasone (FLONASE) 50 MCG/ACT nasal spray Place 2 sprays into both nostrils daily as needed. ALLERGIES   furosemide (LASIX) 40 MG tablet Take 40 mg by mouth daily as needed (swelling).    hydrALAZINE (APRESOLINE) 25 MG tablet Take 25 mg by mouth  daily.   hydrochlorothiazide (HYDRODIURIL) 25 MG tablet Take 25 mg by mouth daily.   isosorbide mononitrate (IMDUR) 30 MG 24 hr tablet Take 30 mg by mouth daily.   losartan (COZAAR) 50 MG tablet Take 2 tablets by mouth daily.   nitroGLYCERIN (NITROSTAT) 0.4 MG SL tablet Place 1 tablet (0.4 mg total) under the tongue as needed.   potassium chloride SA (K-DUR,KLOR-CON) 20 MEQ tablet Take 1 tablet by mouth daily.   RABEprazole (ACIPHEX) 20 MG tablet Take 20 mg by mouth daily.    rosuvastatin (CRESTOR) 10 MG tablet Take 10 mg by mouth daily.        Allergies:   Aspirin, Cardizem [diltiazem], Digoxin and related, Diltiazem hcl, Metoprolol, Ouabain, Pantoprazole sodium, and Propafenone hcl er    Social History         Socioeconomic History   Marital status: Married      Spouse name: Not on file   Number of children: Not on file   Years of education: Not on file   Highest education level: Not on file  Occupational  History   Not on file  Tobacco Use   Smoking status: Former      Packs/day: 0.33      Years: 7.00      Pack years: 2.31      Types: Cigarettes      Quit date: 74      Years since quitting: 47.7   Smokeless tobacco: Never  Vaping Use   Vaping Use: Never used  Substance and Sexual Activity   Alcohol use: No   Drug use: No   Sexual activity: Not Currently  Other Topics Concern   Not on file  Social History Narrative   Not on file    Social Determinants of Health    Financial Resource Strain: Not on file  Food Insecurity: Not on file  Transportation Needs: Not on file  Physical Activity: Not on file  Stress: Not on file  Social Connections: Not on file      Family History: The patient's family history is not on file.   ROS:   Please see the history of present illness.    All other systems reviewed and are negative.   EKGs/Labs/Other Studies Reviewed:     The following studies were reviewed today:   Prior pacemaker interrogations for his His bundle area pacemaker system.   EKG from August 24, 2020 shows rate controlled atrial fibrillation       Recent Labs: No results found for requested labs within last 8760 hours.  Recent Lipid Panel Labs (Brief)  No results found for: CHOL, TRIG, HDL, CHOLHDL, VLDL, LDLCALC, LDLDIRECT     Physical Exam:     VS:  BP 128/78   Pulse 74   Ht 5\' 11"  (1.803 m)   Wt 228 lb (103.4 kg)   SpO2 95%   BMI 31.80 kg/m         Wt Readings from Last 3 Encounters:  10/04/20 228 lb (103.4 kg)  08/24/20 233 lb (105.7 kg)  07/28/19 231 lb 12.8 oz (105.1 kg)      GEN:  Well nourished, well developed in no acute distress HEENT: Normal NECK: No JVD; No carotid bruits LYMPHATICS: No lymphadenopathy CARDIAC: Irregularly irregular, no murmurs, rubs, gallops RESPIRATORY:  Clear to auscultation without rales, wheezing or rhonchi  ABDOMEN: Soft, non-tender, non-distended MUSCULOSKELETAL:  No edema; No deformity  SKIN: Warm and  dry NEUROLOGIC:  Alert and oriented x 3 PSYCHIATRIC:  Normal affect    ASSESSMENT:     1. Complete heart block (HCC)   2. Tachycardia-bradycardia syndrome (HCC)   3. Permanent atrial fibrillation (HCC)   4. Gastrointestinal hemorrhage, unspecified gastrointestinal hemorrhage type     PLAN:     In order of problems listed above:   1. Complete heart block (HCC) Device functioning well.   2. Tachycardia-bradycardia syndrome (HCC) See #1   3. Permanent atrial fibrillation [facility]) Patient has a long history of atrial fibrillation.  He is currently not maintained on anticoagulation because of previous history of GI bleeding.  His more severe bleeding in the past was thought to be secondary to peptic ulcer disease.  We discussed the patient's stroke risk and recommendations for long-term anticoagulation.  We discussed a left atrial appendage occlusion can be a tool to help reduce the stroke risk in patients who are not felt to be candidates for long-term anticoagulation.  I discussed the watchman procedure in detail with the patient including the risks, recovery.  He and his wife will think about the procedure and let us know if they would like to proceed.  If he does elect to proceed with watchman implant, he will need a CT for preop planning.  He will also need an updated echocardiogram.   Prior to [facility] implant, I would transition his medication regimen to Eliquis 5 mg by mouth twice daily and aspirin 81 mg by mouth daily.  We would continue this regimen until the 45-day mark post implant.  At that time we will transition to aspirin and Plavix.   -------------------------------------------------------------   I have seen Laneta Simmers Placzek in the office today who is being considered for a Watchman left atrial appendage closure device. I believe they will benefit from this procedure given their history of atrial fibrillation, CHA2DS2-VASc score of at least 3 and unadjusted ischemic stroke rate  of 3.3% per year. Unfortunately, the patient is not felt to be a long term anticoagulation candidate secondary to previous GI bleeding. The patient's chart has been reviewed and I feel that they would be a candidate for short term oral anticoagulation after Watchman implant.    It is my belief that after undergoing a LAA closure procedure, Zaden Sako Grunder will not need long term anticoagulation which eliminates anticoagulation side effects and major bleeding risk.    Procedural risks for the Watchman implant have been reviewed with the patient including a 0.5% risk of stroke, <1% risk of perforation and <1% risk of device embolization. The risks of TEE and general anaesthesia complications were also discussed during today's appointment.     The published clinical data on the safety and effectiveness of WATCHMAN include but are not limited to the following: - Holmes DR, Everlene Farrier, Sick P et al. for the PROTECT AF Investigators. Percutaneous closure of the left atrial appendage versus warfarin therapy for prevention of stroke in patients with atrial fibrillation: a randomised non-inferiority trial. Lancet 2009; 374: 534-42. Everlene Farrier, Doshi SK, Isa Rankin D et al. on behalf of the PROTECT AF Investigators. Percutaneous Left Atrial Appendage Closure for Stroke Prophylaxis in Patients With Atrial Fibrillation 2.3-Year Follow-up of the PROTECT AF (Watchman Left Atrial Appendage System for Embolic Protection in Patients With Atrial Fibrillation) Trial. Circulation 2013; 127:720-729. - Alli O, Doshi S,  Kar S, Reddy VY, Sievert H et al. Quality of Life Assessment in the Randomized PROTECT AF (Percutaneous Closure of the Left Atrial Appendage Versus Warfarin Therapy for Prevention of Stroke  in Patients With Atrial Fibrillation) Trial of Patients at Risk for Stroke With Nonvalvular Atrial Fibrillation. J Am Coll Cardiol 2013; P4788364. Vertell Limber DR, Tarri Abernethy, Price M, [place], Sievert H, Doshi S, Huber K,  Reddy V. Prospective randomized evaluation of the Watchman left atrial appendage Device in patients with atrial fibrillation versus long-term warfarin therapy; the PREVAIL trial. Journal of the [employer] of Cardiology, Vol. 4, No. 1, 2014, 1-11. - Kar S, Doshi SK, Sadhu A, Horton R, Osorio J et al. Primary outcome evaluation of a next-generation left atrial appendage closure device: results from the PINNACLE FLX trial. Circulation 2021;143(18)1754-1762.      After today's visit with the patient which was dedicated solely for shared decision making visit regarding LAA closure device, the patient elected to wait and call us back should he wish to proceed with the LAA appendage closure procedure.       4. Gastrointestinal hemorrhage, unspecified gastrointestinal hemorrhage type See above       --------------------------------  I have seen, examined the patient, and reviewed the above assessment and plan.    Plan for Watchman today.   Vickie Epley, MD 11/22/2020 11:05 AM

## 2020-11-22 NOTE — Anesthesia Preprocedure Evaluation (Signed)
Anesthesia Evaluation  Patient identified by MRN, date of birth, ID band Patient awake    Reviewed: Allergy & Precautions, NPO status , Patient's Chart, lab work & pertinent test results, reviewed documented beta blocker date and time   History of Anesthesia Complications (+) PONV and history of anesthetic complications  Airway Mallampati: II  TM Distance: >3 FB Neck ROM: Full    Dental  (+) Teeth Intact, Dental Advisory Given   Pulmonary neg shortness of breath, neg sleep apnea, neg COPD, neg recent URI, former smoker,    breath sounds clear to auscultation       Cardiovascular hypertension, Pt. on medications and Pt. on home beta blockers (-) angina+ CAD, + Past MI and +CHF  + dysrhythmias Atrial Fibrillation + pacemaker + Valvular Problems/Murmurs  Rhythm:Regular  1. Left ventricular ejection fraction, by estimation, is 60 to 65%. The  left ventricle has normal function. The left ventricle has no regional  wall motion abnormalities. Left ventricular diastolic function could not  be evaluated.  2. Right ventricular systolic function is normal. The right ventricular  size is normal. There is normal pulmonary artery systolic pressure. The  estimated right ventricular systolic pressure is 23.1 mmHg.  3. Left atrial size was mildly dilated.  4. The mitral valve is degenerative. Mild mitral valve regurgitation. No  evidence of mitral stenosis. Moderate to severe mitral annular  calcification.  5. The aortic valve is normal in structure. Aortic valve regurgitation is  not visualized. No aortic stenosis is present.  6. The inferior vena cava is normal in size with greater than 50%  respiratory variability, suggesting right atrial pressure of 3 mmHg.    Neuro/Psych  Headaches, neg Seizures    GI/Hepatic Neg liver ROS, GERD  Medicated,  Endo/Other  negative endocrine ROS  Renal/GU negative Renal ROS      Musculoskeletal  (+) Arthritis ,   Abdominal   Peds  Hematology  (+) Blood dyscrasia, , eliquis  Lab Results      Component                Value               Date                      WBC                      5.3                 11/19/2020                HGB                      14.1                11/19/2020                HCT                      42.4                11/19/2020                MCV                      91  11/19/2020                PLT                      215                 11/19/2020              Anesthesia Other Findings   Reproductive/Obstetrics                             Anesthesia Physical Anesthesia Plan  ASA: 3  Anesthesia Plan: General   Post-op Pain Management:    Induction: Intravenous  PONV Risk Score and Plan: 3 and Ondansetron, Dexamethasone, Propofol infusion and TIVA  Airway Management Planned: Oral ETT  Additional Equipment:   Intra-op Plan:   Post-operative Plan: Extubation in OR  Informed Consent: I have reviewed the patients History and Physical, chart, labs and discussed the procedure including the risks, benefits and alternatives for the proposed anesthesia with the patient or authorized representative who has indicated his/her understanding and acceptance.     Dental advisory given  Plan Discussed with: Anesthesiologist  Anesthesia Plan Comments: (clearsight)        Anesthesia Quick Evaluation

## 2020-11-23 LAB — BASIC METABOLIC PANEL
Anion gap: 8 (ref 5–15)
BUN: 15 mg/dL (ref 8–23)
CO2: 25 mmol/L (ref 22–32)
Calcium: 8.9 mg/dL (ref 8.9–10.3)
Chloride: 104 mmol/L (ref 98–111)
Creatinine, Ser: 1.11 mg/dL (ref 0.61–1.24)
GFR, Estimated: >60 mL/min (ref >60)
Glucose, Bld: 141 mg/dL — ABNORMAL HIGH (ref 70–99)
Potassium: 3.8 mmol/L (ref 3.5–5.1)
Sodium: 137 mmol/L (ref 135–145)

## 2020-11-23 MED ORDER — AMOXICILLIN 500 MG PO CAPS: 2000.0000 mg | ORAL_CAPSULE | ORAL | 12 refills | Status: DC

## 2020-11-23 NOTE — Discharge Summary (Signed)
[facility]    Patient ID: [name] [name],  MRN: [id number], DOB/AGE: 72-Jun-1952 72 y.o.  Admit date: 11/22/2020 Discharge date: 11/23/2020  Primary Care Physician: Renaldo Reel, DO  Primary Cardiologist: Cristopher Peru, MD/ Dr. Quentin Ore, MD [facility])  Electrophysiologist: None  Primary Discharge Diagnosis:  Permanent Atrial Fibrillation Poor candidacy for long term anticoagulation due to h/o GI bleeding  Secondary Discharge Diagnosis:  Hx of WPW, chronic diastolic CHF, HTN, and tachybrady syndrome s/p PPM placement  Procedures This Admission:  Transeptal Puncture Intra-procedural TEE which showed no LAA thrombus Left atrial appendage occlusive device placement on 11/22/20 by Dr. Quentin Ore.   This study demonstrated:  PASS Criteria: Final position of the WATCHMAN device revealed that it was located at the LAA ostium.  Tug test was performed and adequate.  Measurements by TEE revealed compression of 14 - 23%.  Doppler revealed no leak.  The device was released from the delivery catheter. The access system was then removed from the body and the sheaths were aspirated and flushed. Heparin was partially reversed with Protamine. The sheaths were removed and hemostasis was achieved. EBL<21ml.  There were no early apparent complications. TEE at the end of the case showed no increased pericardial effusion.  CONCLUSIONS:  1.Successful implantation of a WATCHMAN 82mm left atrial appendage occlusive device    2. TEE demonstrating no LAA thrombus 3. No early apparent complications.   Post Implant Anticoagulation Strategy: Aspirin 81mg  PO daily and Apixaban 5mg  PO BID for 45 days. Will plan for CT imaging at the 45 day mark. If adequate seal on the CT, will transition to Aspirin 81mg  PO daily and Plavix 75mg  PO daily to complete 6 months total therapy post implant. After 6 months, transition to Aspirin 81mg  PO daily.    Brief HPI: [name] [name] is a 70 y.o. male with a  history of permanent atrial fibrillation, hx of GI bleed with bleeding peptic ulcers, hx of WPW, chronic diastolic CHF, HTN, and tachybrady syndrome s/p PPM placement. He was referred to Dr. Quentin Ore from Dr. Lovena Le for potential Watchman implantation.    The patient was seen by Dr. Quentin Ore 10/04/20 for consultation, the patient was felt to be a good candidate for Watchman and he was referred for further imaging with CT and echocardiogram. Echo from 10/18/20 showed an LVEF at 60 to 65% with mild MR, and no AS.  Cardiac CT with anatomy suitable for watchman FL X 51mm device with 23% compression.   Hospital Course:  The patient was admitted and underwent left atrial appendage occlusive device placement with 24mm Watchman Flex Device. He was monitored on telemetry overnight which demonstrated atrial fibrillation. Groin site was without complication on the day of discharge. The patient was examined and considered to be stable for discharge. Wound care and restrictions were reviewed with the patient. The patient has been scheduled for post procedure follow up with Kathyrn Drown, NP in approximately  1 month. Medication plan will be Aspirin 81mg  PO daily and Apixaban 5mg  PO BID for 45 days. Will plan for CT imaging at the 45 day mark. If adequate seal on the CT, will transition to Aspirin 81mg  PO daily and Plavix 75mg  PO daily to complete 6 months total therapy post implant. After 6 months, transition to Aspirin 81mg  PO daily. SBE prophylaxis with Amoxicillin 2g one hour prior to dental cleanings or procedure. This has been sent to his preferred pharmacy.   Physical Exam: Vitals:   11/22/20 2009  11/22/20 2358 11/23/20 0324 11/23/20 0732  BP: 111/69 136/82 118/82 137/78  Pulse: 77 87 65 82  Resp: 18 18 19 13   Temp: 97.9 F (36.6 C) 97.8 F (36.6 C) 98.2 F (36.8 C) 98.3 F (36.8 C)  TempSrc: Oral Oral Oral Oral  SpO2: 96% 97% 94% 96%  Weight:      Height:       General: Well developed, well nourished,  NAD Lungs:Clear to ausculation bilaterally. No wheezes, rales, or rhonchi. Breathing is unlabored. Cardiovascular: Irregularly irregular  Extremities: No edema. Neuro: Alert and oriented. No focal deficits. No facial asymmetry. MAE spontaneously. Psych: Responds to questions appropriately with normal affect.    Labs: Lab Results  Component Value Date   WBC 5.3 11/19/2020   HGB 14.1 11/19/2020   HCT 42.4 11/19/2020   MCV 91 11/19/2020   PLT 215 11/19/2020    Recent Labs  Lab 11/23/20 0046  NA 137  K 3.8  CL 104  CO2 25  BUN 15  CREATININE 1.11  CALCIUM 8.9  GLUCOSE 141*   Discharge Medications:  Allergies as of 11/23/2020       Reactions   Digoxin And Related Other (See Comments)   unknown   Diltiazem Hcl    Generalized Edema (intolerance)   Metoprolol Other (See Comments)   Fluid retention   Ouabain    Generalized Edema (intolerance)   Pantoprazole Sodium Other (See Comments)   Stomach pain   Propafenone Hcl Er Other (See Comments)   H/A, N/V, stomach upset        Medication List     TAKE these medications    amoxicillin 500 MG capsule Commonly known as: AMOXIL Take 4 capsules (2,000 mg total) by mouth as directed. Take 4 tablets 1 hour prior to dental work, including cleanings.   apixaban 5 MG Tabs tablet Commonly known as: ELIQUIS Take 1 tablet (5 mg total) by mouth 2 (two) times daily.   aspirin 81 MG EC tablet Take 81 mg by mouth daily.   carvedilol 20 MG 24 hr capsule Commonly known as: COREG CR Take 2 capsules (40 mg total) by mouth daily.   fluticasone 50 MCG/ACT nasal spray Commonly known as: FLONASE Place 2 sprays into both nostrils daily as needed. ALLERGIES   furosemide 40 MG tablet Commonly known as: LASIX Take 40 mg by mouth daily as needed (swelling).   hydrALAZINE 25 MG tablet Commonly known as: APRESOLINE Take 25 mg by mouth daily.   hydrochlorothiazide 25 MG tablet Commonly known as: HYDRODIURIL Take 25 mg by mouth  daily.   isosorbide mononitrate 30 MG 24 hr tablet Commonly known as: IMDUR Take 30 mg by mouth daily.   losartan 50 MG tablet Commonly known as: COZAAR Take 100 mg by mouth daily.   nitroGLYCERIN 0.4 MG SL tablet Commonly known as: NITROSTAT Place 1 tablet (0.4 mg total) under the tongue as needed.   potassium chloride SA 20 MEQ tablet Commonly known as: KLOR-CON Take 20 mEq by mouth daily.   RABEprazole 20 MG tablet Commonly known as: ACIPHEX Take 20 mg by mouth daily.   rosuvastatin 10 MG tablet Commonly known as: CRESTOR Take 10 mg by mouth daily.        Disposition:  Home  Discharge Instructions     Call MD for:  difficulty breathing, headache or visual disturbances   Complete by: As directed    Call MD for:  extreme fatigue   Complete by: As directed  Call MD for:  hives   Complete by: As directed    Call MD for:  persistant dizziness or light-headedness   Complete by: As directed    Call MD for:  persistant nausea and vomiting   Complete by: As directed    Call MD for:  redness, tenderness, or signs of infection (pain, swelling, redness, odor or green/yellow discharge around incision site)   Complete by: As directed    Call MD for:  severe uncontrolled pain   Complete by: As directed    Call MD for:  temperature >100.4   Complete by: As directed    Diet - low sodium heart healthy   Complete by: As directed    Discharge instructions   Complete by: As directed    [facility] Procedure, Care After  Procedure MD: Dr. Isidoro Donning Clinical Coordinator: Karsten Fells, RN  This sheet gives you information about how to care for yourself after your procedure. Your health care provider may also give you more specific instructions. If you have problems or questions, contact your health care provider.  What can I expect after the procedure? After the procedure, it is common to have: Bruising around your puncture site. Tenderness around your puncture  site. Tiredness (fatigue).  Medication instructions It is very important to continue to take your blood thinner as directed by your doctor after the Watchman procedure. Call your procedure doctor's office with question or concerns. If you are on Coumadin (warfarin), you will have your INR checked the week after your procedure, with a goal INR of 2.0 - 3.0. Please follow your medication instructions on your discharge summary. Only take the medications listed on your discharge paperwork.  Follow up You will be seen in 1 month after your procedure in the [facility] You will have another TEE (Transesophageal Echocardiogram) approximately 6 weeks after your procedure mark to check your device You will follow up the MD/APP who performed your procedure 6 months after your procedure The Watchman Clinical Coordinator will check in with you from time to time, including 1 and 2 years after your procedure.    Follow these instructions at home: Puncture site care  Follow instructions from your health care provider about how to take care of your puncture site. Make sure you: If present, leave stitches (sutures), skin glue, or adhesive strips in place.  If a large square bandage is present, this may be removed 24 hours after surgery.  Check your puncture site every day for signs of infection. Check for: Redness, swelling, or pain. Fluid or blood. If your puncture site starts to bleed, lie down on your back, apply firm pressure to the area, and contact your health care provider. Warmth. Pus or a bad smell. Driving Do not drive yourself home if you received sedation Do not drive for at least 4 days after your procedure or however long your health care provider recommends. (Do not resume driving if you have previously been instructed not to drive for other health reasons.) Do not spend greater than 1 hour at a time in a car for the first 3 days. Stop and take a break with a 5 minute walk at  least every hour.  Do not drive or use heavy machinery while taking prescription pain medicine.  Activity Avoid activities that take a lot of effort, including exercise, for at least 7 days after your procedure. For the first 3 days, avoid sitting for longer than one hour at a time.  Avoid  alcoholic beverages, signing paperwork, or participating in legal proceedings for 24 hours after receiving sedation Do not lift anything that is heavier than 10 lb (4.5 kg) for one week.  No sexual activity for 1 week.  Return to your normal activities as told by your health care provider. Ask your health care provider what activities are safe for you. General instructions Take over-the-counter and prescription medicines only as told by your health care provider. Do not use any products that contain nicotine or tobacco, such as cigarettes and e-cigarettes. If you need help quitting, ask your health care provider. You may shower after 24 hours, but Do not take baths, swim, or use a hot tub for 1 week.  Do not drink alcohol for 24 hours after your procedure. Keep all follow-up visits as told by your health care provider. This is important. Dental Work: You will require antibiotics prior to any dental work, including cleanings, for 6 months after your Watchman implantation to help protect you from infection. After 6 months, antibiotics are no longer required. Contact a health care provider if: You have redness, mild swelling, or pain around your puncture site. You have soreness in your throat or at your puncture site that does not improve after several days You have fluid or blood coming from your puncture site that stops after applying firm pressure to the area. Your puncture site feels warm to the touch. You have pus or a bad smell coming from your puncture site. You have a fever. You have chest pain or discomfort that spreads to your neck, jaw, or arm. You are sweating a lot. You feel nauseous. You have  a fast or irregular heartbeat. You have shortness of breath. You are dizzy or light-headed and feel the need to lie down. You have pain or numbness in the arm or leg closest to your puncture site. Get help right away if: Your puncture site suddenly swells. Your puncture site is bleeding and the bleeding does not stop after applying firm pressure to the area. These symptoms may represent a serious problem that is an emergency. Do not wait to see if the symptoms will go away. Get medical help right away. Call your local emergency services (911 in the [place] Do not drive yourself to the hospital. Summary After the procedure, it is normal to have bruising and tenderness at the puncture site in your groin, neck, or forearm. Check your puncture site every day for signs of infection. Get help right away if your puncture site is bleeding and the bleeding does not stop after applying firm pressure to the area. This is a medical emergency.  You will need to take antibiotics prior to dental cleanings for dental procedures for the next 6 months. I have sent amoxicillin with instructions to your pharmacy.   This information is not intended to replace advice given to you by your health care provider. Make sure you discuss any questions you have with your health care provider.   Increase activity slowly   Complete by: As directed        Follow-up Information     Tommie Raymond, NP. Go on 12/17/2020.   Specialty: Cardiology Why: at 12pm. Please arrive to your appointment at 11:45am Contact information: [place] [address] [phone]                 Duration of Discharge Encounter: Greater than 30 minutes including physician time.  Signed, Kathyrn Drown, NP  11/23/2020 10:37 AM

## 2020-11-23 NOTE — Discharge Instructions (Signed)
Information on my medicine - ELIQUIS (apixaban)  This medication education was reviewed with me or my healthcare representative as part of my discharge preparation.    Why was Eliquis prescribed for you? Eliquis was prescribed for you to reduce the risk of forming blood clots that can cause a stroke.  What do You need to know about Eliquis ? Take your Eliquis TWICE DAILY - one tablet in the morning and one tablet in the evening with or without food.  It would be best to take the doses about the same time each day. You will take this for 45 days (through 01/06/21).  If you have difficulty swallowing the tablet whole please discuss with your pharmacist how to take the medication safely.  Take Eliquis exactly as prescribed by your doctor and DO NOT stop taking Eliquis without talking to the doctor who prescribed the medication.  Stopping may increase your risk of developing a new clot or stroke.  Refill your prescription before you run out.  After discharge, you should have regular check-up appointments with your healthcare provider that is prescribing your Eliquis.  In the future your dose may need to be changed if your kidney function or weight changes by a significant amount or as you get older.  What do you do if you miss a dose? If you miss a dose, take it as soon as you remember on the same day and resume taking twice daily.  Do not take more than one dose of ELIQUIS at the same time.  Important Safety Information A possible side effect of Eliquis is bleeding. You should call your healthcare provider right away if you experience any of the following: Bleeding from an injury or your nose that does not stop. Unusual colored urine (red or dark brown) or unusual colored stools (red or black). Unusual bruising for unknown reasons. A serious fall or if you hit your head (even if there is no bleeding).  Some medicines may interact with Eliquis and might increase your risk of bleeding  or clotting while on Eliquis. To help avoid this, consult your healthcare provider or pharmacist prior to using any new prescription or non-prescription medications, including herbals, vitamins, non-steroidal anti-inflammatory drugs (NSAIDs) and supplements.  This website has more information on Eliquis (apixaban): www.Eliquis.com.   

## 2020-11-24 NOTE — Anesthesia Postprocedure Evaluation (Signed)
Anesthesia Post Note  Patient: [name] [name]  Procedure(s) Performed: LEFT ATRIAL APPENDAGE OCCLUSION TRANSESOPHAGEAL ECHOCARDIOGRAM (TEE)     Patient location during evaluation: Cath Lab Anesthesia Type: General Level of consciousness: awake and alert Pain management: pain level controlled Vital Signs Assessment: post-procedure vital signs reviewed and stable Respiratory status: spontaneous breathing, nonlabored ventilation, respiratory function stable and patient connected to nasal cannula oxygen Cardiovascular status: blood pressure returned to baseline and stable Postop Assessment: no apparent nausea or vomiting Anesthetic complications: no   No notable events documented.  Last Vitals:  Vitals:   11/23/20 0324 11/23/20 0732  BP: 118/82 137/78  Pulse: 65 82  Resp: 19 13  Temp: 36.8 C 36.8 C  SpO2: 94% 96%    Last Pain:  Vitals:   11/23/20 0732  TempSrc: Oral  PainSc: 0-No pain                 Jamiria Langill

## 2020-11-26 ENCOUNTER — Telehealth: Payer: Self-pay

## 2020-11-26 NOTE — Telephone Encounter (Signed)
  [facility]   Watchman Team  Contacted the patient regarding discharge from [facility] on 11/23/2020   The patient understands to follow up with Georgie Chard on 12/17/2020 in preparation for post-Watchman CT on 12/27  The patient understands discharge instructions? Yes  The patient understands medications and regimen? Yes - he is taking ASA and Eliquis 5 mg BID as directed   The patient reports groin sites look healthy. No bleeding, oozing or S/S of infection.  The patient understands to call with any questions or concerns prior to scheduled visit.

## 2020-11-28 ENCOUNTER — Telehealth: Payer: Self-pay

## 2020-11-28 NOTE — Telephone Encounter (Signed)
[name] [name] reports he thinks his Eliquis is causing migraines. He started Eliquis 5 mg BID 10/24/2020 in preparation for LAAO 11/22/2020. He states he has had a mild headache that started the weekend before Watchman implant that he brushed off and blamed on his glasses, but then the headache never really went away. He has a history of migraines and reports the headaches he feels now are exactly like the ones he used to have prior to getting a "full on migraine." He has no migraine medication and has not taken anything as the headaches "aren't quite bad enough yet." He requests to stop Eliquis. He understands he will be called with medication recommendations.

## 2020-11-29 NOTE — Telephone Encounter (Signed)
The patient states he feels ok and will try Tylenol for HA. He understands his HA is likely a coincidence and is not likely caused by Eliquis, but he knows there is an option for switching medications should he like. He was grateful for call and agrees with plan.

## 2020-11-29 NOTE — Telephone Encounter (Signed)
Can take an Excedrin to see if it helps (wouldn't recommend high dose/taking it chronically since he's already on aspirin and DOAC), otherwise ok to try changing to Xarelto, he would qualify for 20mg  daily and can use free 1 month copay card.

## 2020-12-13 NOTE — Progress Notes (Signed)
[facility]                                     Cardiology Office Note:    Date:  12/17/2020   ID:  MALEKE [name], DOB 03-13-1950, MRN [id number]  PCP:  Renaldo Reel, DO  [facility] Cardiologist:  Cristopher Peru, MD/ Dr. Quentin Ore, MD [facility])  [facility] Electrophysiologist:  None   Referring MD: Renaldo Reel, DO   Chief Complaint  Patient presents with   Follow-up    1 month s/p Watchman    History of Present Illness:    [name] [name] is a 70 y.o. male with a hx of permanent atrial fibrillation, hx of GI bleed with bleeding peptic ulcers, hx of WPW, chronic diastolic CHF, HTN, and tachybrady syndrome s/p PPM placement. He was referred to Dr. Quentin Ore from Dr. Lovena Le for potential Watchman implantation.    The patient was seen by Dr. Quentin Ore 10/04/20 for consultation, the patient was felt to be a good candidate for Watchman and he was referred for further imaging with CT and echocardiogram. Echo from 10/18/20 showed an LVEF at 60 to 65% with mild MR, and no AS.  Cardiac CT with anatomy suitable for watchman FL X 53mm device with 23% compression.   He was admitted and underwent left atrial appendage occlusive device placement with 72mm Watchman Flex Device. He was monitored on telemetry overnight which demonstrated atrial fibrillation. Medication plan was for Aspirin 81mg  PO daily and Apixaban 5mg  PO BID for 45 days. Will plan for CT imaging at the 45 day mark. If adequate seal on the CT, plan will be transition to Aspirin 81mg  PO daily and Plavix 75mg  PO daily to complete 6 months total therapy post implant. After 6 months, transition to Aspirin 81mg  PO daily. SBE prophylaxis with Amoxicillin 2g one hour prior to dental cleanings or procedure. This has been sent to his preferred pharmacy.   He presents today and reports that he has been dealing with a right underarm cyst which he feels has been irritated by the Eliquis. He was seen by his PCP at which time the  cyst was drained and he was placed on antibiotics. He denies recent fevers or chills. He reports that the cyst has been present for at least 6 months however did not get infected until after starting the Eliquis. Site looks pink with no drainage or odor today. He completes abx in 3 days. He is asking id he can transition off the Eliquis. I will discuss with Dr. Quentin Ore however feel sure that this is not coming from the Eliquis. He is scheduled for follow up CT scan 12/27. Denies chest pain, palpitations, bleeding in stool or urine, no LE edema, orthopnea, dizziness, or syncope.    Past Medical History:  Diagnosis Date   Arthritis    "all" (01/22/2017)   ATRIAL FIBRILLATION 06/26/2008   Qualifier: Diagnosis of  By: Lovena Le, MD, [facility], Binnie Kand    BRADYCARDIA 06/06/2008   Qualifier: Diagnosis of  By: Darrick Meigs     CAD (coronary artery disease), native coronary artery    a. Cath 02/2018 @ [facility] - 50-70% ostial Lcx; 50% diagonal; 25% ostial LAD; 25% mid LAD; 75% dLAD; 75% dRCA >>> medical therapy   CARDIOMYOPATHY, SECONDARY 06/06/2008   Qualifier: Diagnosis of  By: Darrick Meigs     chronic diastolic CHF  Essential hypertension 06/06/2008   Qualifier: Diagnosis of  By: Flonnie Overman     GERD (gastroesophageal reflux disease)    Heart murmur    High cholesterol    Migraine    "none in the 2000s" (01/22/2017)   PONV (postoperative nausea and vomiting)    Presence of permanent cardiac pacemaker    Presence of Watchman left atrial appendage closure device 11/22/2020   27 mm Watchman FLX with Dr. Lalla Brothers   SINUSITIS 06/06/2008   Qualifier: Diagnosis of  By: Flonnie Overman     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   Qualifier: Diagnosis of  By: Ladona Ridgel, MD, Jerrell Mylar     Past Surgical History:  Procedure Laterality Date   ATRIAL FIBRILLATION ABLATION     CARDIAC CATHETERIZATION     CARDIOVERSION  06/19/2005   DC cardioversion /notes 05/28/2010   INSERT  / REPLACE / REMOVE PACEMAKER  01/22/2017   LEFT ATRIAL APPENDAGE OCCLUSION N/A 11/22/2020   Procedure: LEFT ATRIAL APPENDAGE OCCLUSION;  Surgeon: Lanier Prude, MD;  Location: [facility] INVASIVE CV LAB;  Service: Cardiovascular;  Laterality: N/A;   PACEMAKER IMPLANT N/A 01/22/2017   Procedure: PACEMAKER IMPLANT;  Surgeon: Marinus Maw, MD;  Location: [facility] INVASIVE CV LAB;  Service: Cardiovascular;  Laterality: N/A;   SHOULDER ARTHROSCOPY WITH ROTATOR CUFF REPAIR Left ~ 2015   TEE WITHOUT CARDIOVERSION N/A 11/22/2020   Procedure: TRANSESOPHAGEAL ECHOCARDIOGRAM (TEE);  Surgeon: Lanier Prude, MD;  Location: [facility] INVASIVE CV LAB;  Service: Cardiovascular;  Laterality: N/A;    Current Medications: Current Meds  Medication Sig   amoxicillin (AMOXIL) 500 MG capsule Take 4 capsules (2,000 mg total) by mouth as directed. Take 4 tablets 1 hour prior to dental work, including cleanings.   apixaban (ELIQUIS) 5 MG TABS tablet Take 1 tablet (5 mg total) by mouth 2 (two) times daily.   aspirin 81 MG EC tablet Take 81 mg by mouth daily.   carvedilol (COREG CR) 20 MG 24 hr capsule Take 2 capsules (40 mg total) by mouth daily.   fluticasone (FLONASE) 50 MCG/ACT nasal spray Place 2 sprays into both nostrils daily as needed. ALLERGIES   furosemide (LASIX) 40 MG tablet Take 40 mg by mouth daily as needed (swelling).    hydrALAZINE (APRESOLINE) 25 MG tablet Take 25 mg by mouth daily.   hydrochlorothiazide (HYDRODIURIL) 25 MG tablet Take 25 mg by mouth daily.   hydrocortisone (ANUSOL-HC) 2.5 % rectal cream as needed.   isosorbide mononitrate (IMDUR) 30 MG 24 hr tablet Take 30 mg by mouth daily.   losartan (COZAAR) 50 MG tablet Take 100 mg by mouth daily.   nitroGLYCERIN (NITROSTAT) 0.4 MG SL tablet Place 1 tablet (0.4 mg total) under the tongue as needed.   pantoprazole (PROTONIX) 40 MG tablet daily.   potassium chloride SA (K-DUR,KLOR-CON) 20 MEQ tablet Take 20 mEq by mouth daily.   RABEprazole (ACIPHEX) 20  MG tablet Take 20 mg by mouth daily.    rosuvastatin (CRESTOR) 10 MG tablet Take 10 mg by mouth daily.   sulfamethoxazole-trimethoprim (BACTRIM) 400-80 MG tablet Take by mouth.     Allergies:   Digoxin and related, Diltiazem hcl, Metoprolol, Ouabain, Pantoprazole sodium, and Propafenone hcl er   Social History   Socioeconomic History   Marital status: Married    Spouse name: Not on file   Number of children: Not on file   Years of education: Not on file   Highest education level: Not on file  Occupational History  Not on file  Tobacco Use   Smoking status: Former    Packs/day: 0.33    Years: 7.00    Pack years: 2.31    Types: Cigarettes    Quit date: 55    Years since quitting: 47.9   Smokeless tobacco: Never  Vaping Use   Vaping Use: Never used  Substance and Sexual Activity   Alcohol use: No   Drug use: No   Sexual activity: Not Currently  Other Topics Concern   Not on file  Social History Narrative   Not on file   Social Determinants of Health   Financial Resource Strain: Not on file  Food Insecurity: Not on file  Transportation Needs: Not on file  Physical Activity: Not on file  Stress: Not on file  Social Connections: Not on file     Family History: The patient's family history is not on file.  ROS:   Please see the history of present illness.    All other systems reviewed and are negative.  EKGs/Labs/Other Studies Reviewed:    The following studies were reviewed today:  Procedures This Admission:  Transeptal Puncture Intra-procedural TEE which showed no LAA thrombus Left atrial appendage occlusive device placement on 11/22/20 by Dr. Quentin Ore.    This study demonstrated:   PASS Criteria: Final position of the WATCHMAN device revealed that it was located at the LAA ostium.  Tug test was performed and adequate.  Measurements by TEE revealed compression of 14 - 23%.  Doppler revealed no leak.  The device was released from the delivery catheter.  The access system was then removed from the body and the sheaths were aspirated and flushed. Heparin was partially reversed with Protamine. The sheaths were removed and hemostasis was achieved. EBL<59ml.  There were no early apparent complications. TEE at the end of the case showed no increased pericardial effusion.  CONCLUSIONS:  1.Successful implantation of a WATCHMAN 27mm left atrial appendage occlusive device    2. TEE demonstrating no LAA thrombus 3. No early apparent complications.   Post Implant Anticoagulation Strategy: Aspirin 81mg  PO daily and Apixaban 5mg  PO BID for 45 days. Will plan for CT imaging at the 45 day mark. If adequate seal on the CT, will transition to Aspirin 81mg  PO daily and Plavix 75mg  PO daily to complete 6 months total therapy post implant. After 6 months, transition to Aspirin 81mg  PO daily.    CTA 10/18/20:   ADDENDUM: Left Atrial Appendage:   Morphology: The left atrial appendage has one dominant lobe.   Thrombus: There is a filling defect that resolves on the delayed sequence; this is not consistent with thrombus in the left atrial appendage.   The following measurements were made regarding left atrial appendage closure:   Phase assessed: 35 %   Landing Zone measurement: 27 mm   LAA Length (maximum): 28 mm   Optimal interatrial septum puncture site: a mid-inferior, posterior approach is recommended (see DICOM images)   Optimal deployment angle: RAO 23 CRA 20   Catheter: A double curve catheter is recommended.   Watchman FLX Device: A 35 mm device is recommended with 23% compression.   Other comments: None.   EKG:  EKG is not ordered today.   Recent Labs: 11/19/2020: Hemoglobin 14.1; Platelets 215 11/23/2020: BUN 15; Creatinine, Ser 1.11; Potassium 3.8; Sodium 137  Recent Lipid Panel No results found for: CHOL, TRIG, HDL, CHOLHDL, VLDL, LDLCALC, LDLDIRECT   Physical Exam:    VS:  BP 120/78  Pulse 67   Ht 5\' 11"  (1.803 m)   Wt  224 lb 6.4 oz (101.8 kg)   SpO2 95%   BMI 31.30 kg/m     Wt Readings from Last 3 Encounters:  12/17/20 224 lb 6.4 oz (101.8 kg)  11/22/20 227 lb (103 kg)  11/19/20 228 lb 12.8 oz (103.8 kg)       CHA2DS2-VASc Score = 3  This indicates a 3.2% annual risk of stroke. The patient's score is based upon: CHF History: 0 HTN History: 1 Diabetes History: 0 Stroke History: 0 Vascular Disease History: 1 Age Score: 1 Gender Score: 0      HAS-BLED score  Hypertension Yes  Abnormal renal and liver function (Dialysis, transplant, Cr >2.26 mg/dL /Cirrhosis or Bilirubin >2x Normal or AST/ALT/AP >3x Normal) No  Stroke No  Bleeding Yes  Labile INR (Unstable/high INR) No  Elderly (>65) Yes  Drugs or alcohol (? 8 drinks/week, anti-plt or NSAID) No     General: Well developed, well nourished, NAD Lungs:Clear to ausculation bilaterally. No wheezes, rales, or rhonchi. Breathing is unlabored. Cardiovascular: Irregularly irregular. No murmurs Extremities: No edema. Right underarm open area with pink tissue, no drainage or odor.  Neuro: Alert and oriented. No focal deficits. No facial asymmetry. MAE spontaneously. Psych: Responds to questions appropriately with normal affect.    ASSESSMENT/PLAN:    1. Permanent atrial fibrillation: w/ hx of GI bleed secondary to peptic ulcer disease felt to be a good candidate for Watchman implantation. Underwent CT and echo imaging with anatomy suitable for device implant. Medication plan was for Eliquis and ASA until after 45-day CT performed. SBE with Amoxicillin provided. Will need until 05/2021 for dental cleanings and procedures.   2. HTN: Stable with no changes   3. Complete heart block: PPM in place>>follows with Dr. Lovena Le   4. Tachycardia/bradycardia syndrome: PPM as above   5. Right underarm boil: Followed by PCP. Pt reports he thinks boil was infected due to adding Eliquis to his regimen. Wants to transition off Eliquis however plan to undergo CT  on 12/27. Site looks great with no evidence of active infection. Finishes antibiotics in three days.    Medication Adjustments/Labs and Tests Ordered: Current medicines are reviewed at length with the patient today.  Concerns regarding medicines are outlined above.  Orders Placed This Encounter  Procedures   Basic Metabolic Panel (BMET)   CBC   No orders of the defined types were placed in this encounter.   Patient Instructions  Medication Instructions:  Your physician recommends that you continue on your current medications as directed. Please refer to the Current Medication list given to you today.  *If you need a refill on your cardiac medications before your next appointment, please call your pharmacy*   Lab Work: TODAY: BMET, CBC If you have labs (blood work) drawn today and your tests are completely normal, you will receive your results only by: [place] (if you have MyChart) OR A paper copy in the mail If you have any lab test that is abnormal or we need to change your treatment, we will call you to review the results.   Testing/Procedures: YOUR PROVIDER RECOMMENDS THAT YOU HAVE A CT - SCHEDULED FOR 12/27 @ 1 PM  Follow-Up: At [facility], you and your health needs are our priority.  As part of our continuing mission to provide you with exceptional heart care, we have created designated Provider Care Teams.  These Care Teams include your primary Cardiologist (physician)  and Advanced Practice Providers (APPs -  Physician Assistants and Nurse Practitioners) who all work together to provide you with the care you need, when you need it.  We recommend signing up for the patient portal called "MyChart".  Sign up information is provided on this After Visit Summary.  MyChart is used to connect with patients for Virtual Visits (Telemedicine).  Patients are able to view lab/test results, encounter notes, upcoming appointments, etc.  Non-urgent messages can be sent to your  provider as well.   To learn more about what you can do with MyChart, go to [email or web address].    Your next appointment:   FOLLOW UP AS SCHEDULED   WATCHMAN CT INSTRUCTIONS: Your WATCHMAN CT will be scheduled at [facility]: [place], [place] [address] [phone]  Please arrive at the [facility] main entrance (entrance A) of [facility] 30 minutes prior to test start time. Proceed to the [facility] Radiology Department (first floor) to check-in and test prep.  Please follow these instructions carefully:  Hold all erectile dysfunction medications at least 3 days (72 hrs) prior to test.  On the Night Before the Test: Be sure to Drink plenty of water. Do not consume any caffeinated/decaffeinated beverages or chocolate 12 hours prior to your test. Do not take any antihistamines 12 hours prior to your test.  On the Day of the Test: Drink plenty of water until 1 hour prior to the test. Do not eat any food 4 hours prior to the test. You may take your regular medications prior to the test.  HOLD Furosemide/Hydrochlorothiazide morning of the test. FEMALES- please wear underwire-free bra if available      After the Test: Drink plenty of water. After receiving IV contrast, you may experience a mild flushed feeling. This is normal. On occasion, you may experience a mild rash up to 24 hours after the test. This is not dangerous. If this occurs, you can take Benadryl 25 mg and increase your fluid intake. If you experience trouble breathing, this can be serious. If it is severe call 911 IMMEDIATELY. If it is mild, please call our office. If you take any of these medications: Glipizide/Metformin, Avandament, Glucavance, please do not take 48 hours after completing test unless otherwise instructed.  Once we have confirmed authorization from your insurance company, you will be called to set up a date and time for your test.   For non-scheduling  related questions/concerns about your CT scan, please contact the cardiac imaging nurses: Marchia Bond, Cardiac Imaging Nurse Navigator Gordy Clement, Cardiac Imaging Nurse [facility] Direct Office Dial: [phone]   For scheduling needs, including cancellations and rescheduling, please call [place], [phone].  Lenice Llamas, the Watchman Nurse Navigator, will call you after your CT once the [facility] Team has reviewed your imaging for an update on proceedings. [place]'s direct number is [phone] if you need assistance.    Signed, Kathyrn Drown, NP  12/17/2020 1:41 PM    [facility]

## 2020-12-17 ENCOUNTER — Other Ambulatory Visit: Payer: Self-pay

## 2020-12-17 ENCOUNTER — Encounter: Payer: Self-pay | Admitting: Cardiology

## 2020-12-17 ENCOUNTER — Ambulatory Visit: Payer: Medicare PPO | Admitting: Cardiology

## 2020-12-17 VITALS — BP 120/78 | HR 67 | Ht 71.0 in | Wt 224.4 lb

## 2020-12-17 DIAGNOSIS — I10 Essential (primary) hypertension: Secondary | ICD-10-CM

## 2020-12-17 DIAGNOSIS — K922 Gastrointestinal hemorrhage, unspecified: Secondary | ICD-10-CM | POA: Diagnosis not present

## 2020-12-17 DIAGNOSIS — Z95818 Presence of other cardiac implants and grafts: Secondary | ICD-10-CM

## 2020-12-17 DIAGNOSIS — I4821 Permanent atrial fibrillation: Secondary | ICD-10-CM

## 2020-12-17 DIAGNOSIS — I495 Sick sinus syndrome: Secondary | ICD-10-CM

## 2020-12-17 NOTE — Patient Instructions (Addendum)
Medication Instructions:  Your physician recommends that you continue on your current medications as directed. Please refer to the Current Medication list given to you today.  *If you need a refill on your cardiac medications before your next appointment, please call your pharmacy*   Lab Work: TODAY: BMET, CBC If you have labs (blood work) drawn today and your tests are completely normal, you will receive your results only by: MyChart Message (if you have MyChart) OR A paper copy in the mail If you have any lab test that is abnormal or we need to change your treatment, we will call you to review the results.   Testing/Procedures: YOUR PROVIDER RECOMMENDS THAT YOU HAVE A CT - SCHEDULED FOR 12/27 @ 1 PM  Follow-Up: At [facility], you and your health needs are our priority.  As part of our continuing mission to provide you with exceptional heart care, we have created designated Provider Care Teams.  These Care Teams include your primary Cardiologist (physician) and Advanced Practice Providers (APPs -  Physician Assistants and Nurse Practitioners) who all work together to provide you with the care you need, when you need it.  We recommend signing up for the patient portal called "MyChart".  Sign up information is provided on this After Visit Summary.  MyChart is used to connect with patients for Virtual Visits (Telemedicine).  Patients are able to view lab/test results, encounter notes, upcoming appointments, etc.  Non-urgent messages can be sent to your provider as well.   To learn more about what you can do with MyChart, go to [email or web address].    Your next appointment:   FOLLOW UP AS SCHEDULED   WATCHMAN CT INSTRUCTIONS: Your WATCHMAN CT will be scheduled at [facility]: [address] [place], [place] [address] [phone]  Please arrive at the [facility] main entrance (entrance A) of [facility] 30 minutes prior to test start time. Proceed to the  [facility] Radiology Department (first floor) to check-in and test prep.  Please follow these instructions carefully:  Hold all erectile dysfunction medications at least 3 days (72 hrs) prior to test.  On the Night Before the Test: Be sure to Drink plenty of water. Do not consume any caffeinated/decaffeinated beverages or chocolate 12 hours prior to your test. Do not take any antihistamines 12 hours prior to your test.  On the Day of the Test: Drink plenty of water until 1 hour prior to the test. Do not eat any food 4 hours prior to the test. You may take your regular medications prior to the test.  HOLD Furosemide/Hydrochlorothiazide morning of the test. FEMALES- please wear underwire-free bra if available      After the Test: Drink plenty of water. After receiving IV contrast, you may experience a mild flushed feeling. This is normal. On occasion, you may experience a mild rash up to 24 hours after the test. This is not dangerous. If this occurs, you can take Benadryl 25 mg and increase your fluid intake. If you experience trouble breathing, this can be serious. If it is severe call 911 IMMEDIATELY. If it is mild, please call our office. If you take any of these medications: Glipizide/Metformin, Avandament, Glucavance, please do not take 48 hours after completing test unless otherwise instructed.  Once we have confirmed authorization from your insurance company, you will be called to set up a date and time for your test.   For non-scheduling related questions/concerns about your CT scan, please contact the cardiac  imaging nurses: [name], Cardiac Imaging Nurse Navigator Larey Brick, Cardiac Imaging Nurse Navigator [facility] Direct Office Dial: [phone]   For scheduling needs, including cancellations and rescheduling, please call [place], [phone].  Karsten Fells, the Watchman Nurse Navigator, will call you after your CT once the [facility]  Team has reviewed your imaging for an update on proceedings. [place]'s direct number is [phone] if you need assistance.

## 2020-12-18 LAB — CBC
Hematocrit: 41 % (ref 37.5–51.0)
Hemoglobin: 14 g/dL (ref 13.0–17.7)
MCH: 30.6 pg (ref 26.6–33.0)
MCHC: 34.1 g/dL (ref 31.5–35.7)
MCV: 90 fL (ref 79–97)
Platelets: 300 10*3/uL (ref 150–450)
RBC: 4.57 x10E6/uL (ref 4.14–5.80)
RDW: 13.9 % (ref 11.6–15.4)
WBC: 5.6 10*3/uL (ref 3.4–10.8)

## 2020-12-18 LAB — BASIC METABOLIC PANEL
BUN/Creatinine Ratio: 12 (ref 10–24)
BUN: 16 mg/dL (ref 8–27)
CO2: 23 mmol/L (ref 20–29)
Calcium: 9.6 mg/dL (ref 8.6–10.2)
Chloride: 100 mmol/L (ref 96–106)
Creatinine, Ser: 1.29 mg/dL — ABNORMAL HIGH (ref 0.76–1.27)
Glucose: 83 mg/dL (ref 70–99)
Potassium: 4.3 mmol/L (ref 3.5–5.2)
Sodium: 138 mmol/L (ref 134–144)
eGFR: 59 mL/min/{1.73_m2} — ABNORMAL LOW (ref >59)

## 2021-01-02 ENCOUNTER — Encounter (HOSPITAL_COMMUNITY): Payer: Self-pay

## 2021-01-02 ENCOUNTER — Ambulatory Visit (HOSPITAL_COMMUNITY): Admit: 2021-01-02 | Payer: Medicare PPO | Admitting: Internal Medicine

## 2021-01-02 SURGERY — ECHOCARDIOGRAM, TRANSESOPHAGEAL
Anesthesia: Moderate Sedation

## 2021-01-03 ENCOUNTER — Ambulatory Visit (HOSPITAL_COMMUNITY): Payer: Medicare PPO

## 2021-01-04 ENCOUNTER — Telehealth (HOSPITAL_COMMUNITY): Payer: Self-pay | Admitting: *Deleted

## 2021-01-04 ENCOUNTER — Telehealth: Payer: Self-pay

## 2021-01-04 NOTE — Telephone Encounter (Signed)
The patient reports he had mild rectal bleeding last week. He held Eliquis Friday and Saturday and the bleeding stopped. He restarted the Eliquis on Sunday and he started spotting again a couple days later. He stopped Eliquis Wednesday and the bleeding stopped as well.  Discussed with Carlean Jews. Instructed the patient not to take Eliquis anymore. At this point given active bleeding, he will not start Plavix. He will continue ASA. Confirmed CT 12/27 and he understands he will be called after to check-in.  He was grateful for call and agrees with plan.

## 2021-01-04 NOTE — Addendum Note (Signed)
Addended by: Gunnar Fusi A on: 01/04/2021 12:42 PM   Modules accepted: Orders

## 2021-01-04 NOTE — Telephone Encounter (Signed)
Reaching out to patient to offer assistance regarding upcoming cardiac imaging study; pt verbalizes understanding of appt date/time, parking situation and where to check in, pre-test NPO status and verified current allergies; name and call back number provided for further questions should they arise  Larey Brick RN Navigator Cardiac Imaging [facility] [phone] office [phone] cell  Patient aware to arrive at 12:30pm for his 1pm scan.

## 2021-01-08 ENCOUNTER — Ambulatory Visit (HOSPITAL_COMMUNITY)
Admission: RE | Admit: 2021-01-08 | Discharge: 2021-01-08 | Disposition: A | Discharge status: Home / Self Care | Payer: Medicare PPO | Source: Ambulatory Visit | Attending: Cardiology | Admitting: Cardiology

## 2021-01-08 ENCOUNTER — Other Ambulatory Visit: Payer: Self-pay

## 2021-01-08 DIAGNOSIS — K922 Gastrointestinal hemorrhage, unspecified: Secondary | ICD-10-CM | POA: Diagnosis present

## 2021-01-08 DIAGNOSIS — Z95818 Presence of other cardiac implants and grafts: Secondary | ICD-10-CM | POA: Insufficient documentation

## 2021-01-08 DIAGNOSIS — I4821 Permanent atrial fibrillation: Secondary | ICD-10-CM | POA: Insufficient documentation

## 2021-01-08 MED ORDER — IOHEXOL 350 MG/ML SOLN
100.0000 mL | Freq: Once | INTRAVENOUS | Status: AC | PRN
  Administered 2021-01-08: 14:00:00 100 mL via INTRAVENOUS

## 2021-01-08 MED ORDER — METOPROLOL TARTRATE 5 MG/5ML IV SOLN: 5.0000 mg | INTRAVENOUS | Status: DC | PRN

## 2021-01-08 MED ORDER — METOPROLOL TARTRATE 5 MG/5ML IV SOLN
INTRAVENOUS | Status: AC
  Filled 2021-01-08: qty 10

## 2021-01-11 ENCOUNTER — Telehealth: Payer: Self-pay | Admitting: Cardiology

## 2021-01-11 NOTE — Telephone Encounter (Signed)
° ° °  Per Dr. Lalla Brothers, ok to continue ASA 81mg  PO QD only at this time due to rectal bleeding with low dose Eliquis. Discussed results and plan with patient and wife. All questions answered. 6 month follow up with myself has been scheduled.   NP-C Structural Heart Team  Pager: [phone] Phone: [phone]

## 2021-01-23 ENCOUNTER — Ambulatory Visit (INDEPENDENT_AMBULATORY_CARE_PROVIDER_SITE_OTHER): Payer: Medicare PPO

## 2021-01-23 DIAGNOSIS — I495 Sick sinus syndrome: Secondary | ICD-10-CM

## 2021-01-23 LAB — CUP PACEART REMOTE DEVICE CHECK
Battery Remaining Longevity: 126 mo
Battery Voltage: 3.01 V
Brady Statistic AP VP Percent: 0.02 %
Brady Statistic AP VS Percent: 37.46 %
Brady Statistic AS VP Percent: 0 %
Brady Statistic AS VS Percent: 62.51 %
Brady Statistic RA Percent Paced: 36.81 %
Brady Statistic RV Percent Paced: 0.02 %
Date Time Interrogation Session: 20230110230804
Implantable Lead Implant Date: 20190110
Implantable Lead Implant Date: 20190110
Implantable Lead Location: 753860
Implantable Lead Location: 753860
Implantable Lead Model: 3830
Implantable Lead Model: 5076
Implantable Pulse Generator Implant Date: 20190110
Lead Channel Impedance Value: 361 Ohm
Lead Channel Impedance Value: 456 Ohm
Lead Channel Impedance Value: 513 Ohm
Lead Channel Impedance Value: 513 Ohm
Lead Channel Pacing Threshold Amplitude: 0.875 V
Lead Channel Pacing Threshold Pulse Width: 0.4 ms
Lead Channel Sensing Intrinsic Amplitude: 12.75 mV
Lead Channel Sensing Intrinsic Amplitude: 12.75 mV
Lead Channel Sensing Intrinsic Amplitude: 8.625 mV
Lead Channel Sensing Intrinsic Amplitude: 8.625 mV
Lead Channel Setting Pacing Amplitude: 2 V
Lead Channel Setting Pacing Amplitude: 2 V
Lead Channel Setting Pacing Pulse Width: 0.4 ms
Lead Channel Setting Sensing Sensitivity: 0.9 mV

## 2021-02-01 NOTE — Progress Notes (Signed)
Remote pacemaker transmission.   

## 2021-04-24 ENCOUNTER — Ambulatory Visit (INDEPENDENT_AMBULATORY_CARE_PROVIDER_SITE_OTHER): Payer: Medicare PPO

## 2021-04-24 DIAGNOSIS — I495 Sick sinus syndrome: Secondary | ICD-10-CM

## 2021-04-24 LAB — CUP PACEART REMOTE DEVICE CHECK
Battery Remaining Longevity: 123 mo
Battery Voltage: 3.01 V
Brady Statistic AP VP Percent: 0.02 %
Brady Statistic AP VS Percent: 30.79 %
Brady Statistic AS VP Percent: 0 %
Brady Statistic AS VS Percent: 69.19 %
Brady Statistic RA Percent Paced: 30.14 %
Brady Statistic RV Percent Paced: 0.02 %
Date Time Interrogation Session: 20230412002404
Implantable Lead Implant Date: 20190110
Implantable Lead Implant Date: 20190110
Implantable Lead Location: 753860
Implantable Lead Location: 753860
Implantable Lead Model: 3830
Implantable Lead Model: 5076
Implantable Pulse Generator Implant Date: 20190110
Lead Channel Impedance Value: 361 Ohm
Lead Channel Impedance Value: 456 Ohm
Lead Channel Impedance Value: 513 Ohm
Lead Channel Impedance Value: 532 Ohm
Lead Channel Pacing Threshold Amplitude: 0.875 V
Lead Channel Pacing Threshold Pulse Width: 0.4 ms
Lead Channel Sensing Intrinsic Amplitude: 10.375 mV
Lead Channel Sensing Intrinsic Amplitude: 10.375 mV
Lead Channel Sensing Intrinsic Amplitude: 14.875 mV
Lead Channel Sensing Intrinsic Amplitude: 14.875 mV
Lead Channel Setting Pacing Amplitude: 2 V
Lead Channel Setting Pacing Amplitude: 2 V
Lead Channel Setting Pacing Pulse Width: 0.4 ms
Lead Channel Setting Sensing Sensitivity: 0.9 mV

## 2021-05-03 NOTE — Progress Notes (Signed)
?[facility]   ?                                  ?Cardiology Office Note:   ? ?Date:  05/06/2021  ? ?IDYANIV [name], DOB 03/16/1950, MRN [id number] ? ?PCP:  Renaldo Reel, DO  ?[facility] Cardiologist:  Cristopher Peru, MD/ Dr. Quentin Ore, MD [facility]) ?[place] Electrophysiologist:  None  ? ?Referring MD: Renaldo Reel, DO  ? ?Chief Complaint  ?Patient presents with  ? Follow-up  ?  6 month s/p Watchman   ? ?History of Present Illness:   ? ?[name] [name] is a 71 y.o. male with a hx of permanent atrial fibrillation, hx of GI bleed with bleeding peptic ulcers, hx of WPW, chronic diastolic CHF, HTN, and tachybrady syndrome s/p PPM placement who is here for 6 month s/p Watchman implant.  ?  ?The patient was seen by Dr. Quentin Ore 10/04/20 for consultation, the patient was felt to be a good candidate for Watchman and he was referred for further imaging with CT and echocardiogram. Echo from 10/18/20 showed an LVEF at 60 to 65% with mild MR, and no AS.  Cardiac CT with anatomy suitable for watchman FL X 18mm device with 23% compression.  ?  ?He was admitted and underwent left atrial appendage occlusive device placement with 8mm Watchman Flex Device. Medication plan was for Aspirin 81mg  PO daily and Apixaban 5mg  PO BID for 45 days. If 45 day CT imaging with adequate seal then plan was to transition to Aspirin 81mg  PO daily and Plavix 75mg  PO daily to complete 6 months total therapy post implant. After 6 months, transition to Aspirin 81mg  PO daily.  ?He unfortunately had recurrent rectal bleeding with ASA and Eliquis post procedure therefore he has bene continued on ASA monotherapy. Plan had been discussed with Dr. Quentin Ore. He has since tolerated this well with no recurrent issues. He denies chest pain, palpations, LE edema, SOB, orthopnea, dizziness, or syncope. CT scan performed 01/08/22 showed a small device leak in the superior aspect of the appendage (2.3 mm x 3.6 mm). ?  ?Past Medical  History:  ?Diagnosis Date  ? Arthritis   ? "all" (01/22/2017)  ? ATRIAL FIBRILLATION 06/26/2008  ? Qualifier: Diagnosis of  By: Lovena Le, MD, Leonidas Romberg Binnie Kand   ? BRADYCARDIA 06/06/2008  ? Qualifier: Diagnosis of  By: Darrick Meigs    ? CAD (coronary artery disease), native coronary artery   ? a. Cath 02/2018 @ [facility] - 50-70% ostial Lcx; 50% diagonal; 25% ostial LAD; 25% mid LAD; 75% dLAD; 75% dRCA >>> medical therapy  ? CARDIOMYOPATHY, SECONDARY 06/06/2008  ? Qualifier: Diagnosis of  By: Darrick Meigs    ? chronic diastolic CHF   ? Essential hypertension 06/06/2008  ? Qualifier: Diagnosis of  By: Darrick Meigs    ? GERD (gastroesophageal reflux disease)   ? Heart murmur   ? High cholesterol   ? Migraine   ? "none in the 2000s" (01/22/2017)  ? PONV (postoperative nausea and vomiting)   ? Presence of permanent cardiac pacemaker   ? Presence of Watchman left atrial appendage closure device 11/22/2020  ? 27 mm Watchman FLX with Dr. Quentin Ore  ? SINUSITIS 06/06/2008  ? Qualifier: Diagnosis of  By: Darrick Meigs    ? 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  ? Qualifier: Diagnosis of  By: Lovena Le, MD, Martyn Malay   ? ? ?  Past Surgical History:  ?Procedure Laterality Date  ? ATRIAL FIBRILLATION ABLATION    ? CARDIAC CATHETERIZATION    ? CARDIOVERSION  06/19/2005  ? DC cardioversion /notes 05/28/2010  ? INSERT / REPLACE / REMOVE PACEMAKER  01/22/2017  ? LEFT ATRIAL APPENDAGE OCCLUSION N/A 11/22/2020  ? Procedure: LEFT ATRIAL APPENDAGE OCCLUSION;  Surgeon: Vickie Epley, MD;  Location: [facility];  Service: Cardiovascular;  Laterality: N/A;  ? PACEMAKER IMPLANT N/A 01/22/2017  ? Procedure: PACEMAKER IMPLANT;  Surgeon: Evans Lance, MD;  Location: [place] [facility];  Service: Cardiovascular;  Laterality: N/A;  ? SHOULDER ARTHROSCOPY WITH ROTATOR CUFF REPAIR Left ~ 2015  ? TEE WITHOUT CARDIOVERSION N/A 11/22/2020  ? Procedure: TRANSESOPHAGEAL ECHOCARDIOGRAM (TEE);  Surgeon: Vickie Epley, MD;  Location: [place] [facility];  Service: Cardiovascular;  Laterality: N/A;  ? ? ?Current Medications: ?Current Meds  ?Medication Sig  ? amoxicillin (AMOXIL) 500 MG capsule Take 4 capsules (2,000 mg total) by mouth as directed. Take 4 tablets 1 hour prior to dental work, including cleanings.  ? aspirin 81 MG EC tablet Take 81 mg by mouth daily.  ? carvedilol (COREG CR) 20 MG 24 hr capsule Take 2 capsules (40 mg total) by mouth daily.  ? fluticasone (FLONASE) 50 MCG/ACT nasal spray Place 2 sprays into both nostrils daily as needed. ALLERGIES  ? furosemide (LASIX) 40 MG tablet Take 40 mg by mouth daily as needed (swelling).   ? hydrALAZINE (APRESOLINE) 25 MG tablet Take 25 mg by mouth daily.  ? hydrochlorothiazide (HYDRODIURIL) 25 MG tablet Take 25 mg by mouth daily.  ? hydrocortisone (ANUSOL-HC) 2.5 % rectal cream as needed.  ? isosorbide mononitrate (IMDUR) 30 MG 24 hr tablet Take 30 mg by mouth daily.  ? losartan (COZAAR) 100 MG tablet Take 1 tablet by mouth daily.  ? nitroGLYCERIN (NITROSTAT) 0.4 MG SL tablet Place 1 tablet (0.4 mg total) under the tongue as needed.  ? pantoprazole (PROTONIX) 40 MG tablet daily.  ? potassium chloride SA (K-DUR,KLOR-CON) 20 MEQ tablet Take 20 mEq by mouth daily.  ? RABEprazole (ACIPHEX) 20 MG tablet Take 20 mg by mouth daily.   ? rosuvastatin (CRESTOR) 10 MG tablet Take 10 mg by mouth daily as needed.  ?  ? ?Allergies:   Digoxin and related, Diltiazem hcl, Metoprolol, Ouabain, Pantoprazole sodium, and Propafenone hcl er  ? ?Social History  ? ?Socioeconomic History  ? Marital status: Married  ?  Spouse name: Not on file  ? Number of children: Not on file  ? Years of education: Not on file  ? Highest education level: Not on file  ?Occupational History  ? Not on file  ?Tobacco Use  ? Smoking status: Former  ?  Packs/day: 0.33  ?  Years: 7.00  ?  Pack years: 2.31  ?  Types: Cigarettes  ?  Quit date: 89  ?  Years since quitting: 48.3  ? Smokeless tobacco: Never  ?Vaping  Use  ? Vaping Use: Never used  ?Substance and Sexual Activity  ? Alcohol use: No  ? Drug use: No  ? Sexual activity: Not Currently  ?Other Topics Concern  ? Not on file  ?Social History Narrative  ? Not on file  ? ?Social Determinants of Health  ? ?Financial Resource Strain: Not on file  ?Food Insecurity: Not on file  ?Transportation Needs: Not on file  ?Physical Activity: Not on file  ?Stress: Not on file  ?Social Connections: Not on file  ?  ?  Family History: ?The patient's family history is not on file. ? ?ROS:   ?Please see the history of present illness.    ?All other systems reviewed and are negative. ? ?EKGs/Labs/Other Studies Reviewed:   ? ?The following studies were reviewed today: ? ?CT 01/08/21: ? ?IMPRESSION: ?1. 27 mm Watchman FLX with small device leak in the superior aspect ?of the appendage (2.3 mm x 3.6 mm). ?  ?2. No evidence of device related thrombus. Appropriate thrombus is ?present behind the device and in the LAA. ?  ?3. 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 No evidence of ?unroofed coronary sinus. ?  ?4. Dilated pulmonary artery suggest of pulmonary hypertension. ? ? ?Procedures This Admission:  ?Transeptal Puncture ?Intra-procedural TEE which showed no LAA thrombus ?Left atrial appendage occlusive device placement on 11/22/20 by Dr. Quentin Ore.  ?  ?This study demonstrated: ?  ?PASS Criteria: ?Final position of the WATCHMAN device revealed that it was located at the LAA ostium.  Tug test was performed and adequate.  Measurements by TEE revealed compression of 14 - 23%.  Doppler revealed no leak.  The device was released from the delivery catheter. The access system was then removed from the body and the sheaths were aspirated and flushed. Heparin was partially reversed with Protamine. The sheaths were removed and hemostasis was achieved. EBL<38ml.  There were no early apparent complications. TEE at the end of the case showed no increased pericardial effusion. ? ?CONCLUSIONS:   ?1.Successful implantation of a WATCHMAN 11mm left atrial appendage occlusive device    ?2. TEE demonstrating no LAA thrombus ?3. No early apparent complications.  ? ?Post Implant Anticoagulation Strategy: ?Aspirin 13m

## 2021-05-06 ENCOUNTER — Ambulatory Visit: Payer: Medicare PPO | Admitting: Cardiology

## 2021-05-06 VITALS — BP 120/70 | HR 66 | Ht 71.0 in | Wt 230.0 lb

## 2021-05-06 DIAGNOSIS — K922 Gastrointestinal hemorrhage, unspecified: Secondary | ICD-10-CM

## 2021-05-06 DIAGNOSIS — I10 Essential (primary) hypertension: Secondary | ICD-10-CM

## 2021-05-06 DIAGNOSIS — I442 Atrioventricular block, complete: Secondary | ICD-10-CM

## 2021-05-06 DIAGNOSIS — I495 Sick sinus syndrome: Secondary | ICD-10-CM | POA: Diagnosis not present

## 2021-05-06 DIAGNOSIS — I4821 Permanent atrial fibrillation: Secondary | ICD-10-CM | POA: Diagnosis not present

## 2021-05-06 DIAGNOSIS — Z95818 Presence of other cardiac implants and grafts: Secondary | ICD-10-CM | POA: Diagnosis not present

## 2021-05-06 NOTE — Patient Instructions (Signed)
Medication Instructions:  ?The current medical regimen is effective;  continue present plan and medications. ? ?*If you need a refill on your cardiac medications before your next appointment, please call your pharmacy* ? ?Follow-Up: ?At [facility], you and your health needs are our priority.  As part of our continuing mission to provide you with exceptional heart care, we have created designated Provider Care Teams.  These Care Teams include your primary Cardiologist (physician) and Advanced Practice Providers (APPs -  Physician Assistants and Nurse Practitioners) who all work together to provide you with the care you need, when you need it. ? ?We recommend signing up for the patient portal called "MyChart".  Sign up information is provided on this After Visit Summary.  MyChart is used to connect with patients for Virtual Visits (Telemedicine).  Patients are able to view lab/test results, encounter notes, upcoming appointments, etc.  Non-urgent messages can be sent to your provider as well.   ?To learn more about what you can do with MyChart, go to [email or web address].   ? ?Your next appointment:   ?Due 08/2021 ? ?The format for your next appointment:   ?In Person ? ?Provider:   ?Lewayne Bunting, MD   ? ? ?Thank you for choosing [facility]!! ? ? ? ?Important Information About Sugar ? ? ? ? ?  ?

## 2021-05-10 NOTE — Progress Notes (Signed)
Remote pacemaker transmission.   

## 2021-07-24 ENCOUNTER — Ambulatory Visit (INDEPENDENT_AMBULATORY_CARE_PROVIDER_SITE_OTHER): Payer: Medicare PPO

## 2021-07-24 DIAGNOSIS — I495 Sick sinus syndrome: Secondary | ICD-10-CM

## 2021-07-24 LAB — CUP PACEART REMOTE DEVICE CHECK
Battery Remaining Longevity: 120 mo
Battery Voltage: 3 V
Brady Statistic AP VP Percent: 0.02 %
Brady Statistic AP VS Percent: 36.85 %
Brady Statistic AS VP Percent: 0 %
Brady Statistic AS VS Percent: 63.13 %
Brady Statistic RA Percent Paced: 36.16 %
Brady Statistic RV Percent Paced: 0.02 %
Date Time Interrogation Session: 20230712031609
Implantable Lead Implant Date: 20190110
Implantable Lead Implant Date: 20190110
Implantable Lead Location: 753860
Implantable Lead Location: 753860
Implantable Lead Model: 3830
Implantable Lead Model: 5076
Implantable Pulse Generator Implant Date: 20190110
Lead Channel Impedance Value: 361 Ohm
Lead Channel Impedance Value: 456 Ohm
Lead Channel Impedance Value: 513 Ohm
Lead Channel Impedance Value: 513 Ohm
Lead Channel Pacing Threshold Amplitude: 0.875 V
Lead Channel Pacing Threshold Pulse Width: 0.4 ms
Lead Channel Sensing Intrinsic Amplitude: 11.25 mV
Lead Channel Sensing Intrinsic Amplitude: 11.25 mV
Lead Channel Sensing Intrinsic Amplitude: 8.5 mV
Lead Channel Sensing Intrinsic Amplitude: 8.5 mV
Lead Channel Setting Pacing Amplitude: 2 V
Lead Channel Setting Pacing Amplitude: 2 V
Lead Channel Setting Pacing Pulse Width: 0.4 ms
Lead Channel Setting Sensing Sensitivity: 0.9 mV

## 2021-08-13 NOTE — Progress Notes (Signed)
Remote pacemaker transmission.   

## 2021-09-03 ENCOUNTER — Ambulatory Visit (INDEPENDENT_AMBULATORY_CARE_PROVIDER_SITE_OTHER): Payer: Medicare PPO | Admitting: Internal Medicine

## 2021-09-03 ENCOUNTER — Encounter: Payer: Self-pay | Admitting: Internal Medicine

## 2021-09-03 VITALS — BP 116/82 | HR 67 | Ht 71.0 in | Wt 228.8 lb

## 2021-09-03 DIAGNOSIS — Z950 Presence of cardiac pacemaker: Secondary | ICD-10-CM

## 2021-09-03 DIAGNOSIS — I4891 Unspecified atrial fibrillation: Secondary | ICD-10-CM | POA: Diagnosis not present

## 2021-09-03 DIAGNOSIS — I442 Atrioventricular block, complete: Secondary | ICD-10-CM | POA: Diagnosis not present

## 2021-09-03 DIAGNOSIS — I456 Pre-excitation syndrome: Secondary | ICD-10-CM

## 2021-09-03 LAB — CUP PACEART INCLINIC DEVICE CHECK
Battery Remaining Longevity: 119 mo
Battery Voltage: 3 V
Brady Statistic AP VP Percent: 0.02 %
Brady Statistic AP VS Percent: 35.66 %
Brady Statistic AS VP Percent: 0 %
Brady Statistic AS VS Percent: 64.32 %
Brady Statistic RA Percent Paced: 35 %
Brady Statistic RV Percent Paced: 0.02 %
Date Time Interrogation Session: 20230822144836
Implantable Lead Implant Date: 20190110
Implantable Lead Implant Date: 20190110
Implantable Lead Location: 753860
Implantable Lead Location: 753860
Implantable Lead Model: 3830
Implantable Lead Model: 5076
Implantable Pulse Generator Implant Date: 20190110
Lead Channel Impedance Value: 380 Ohm
Lead Channel Impedance Value: 532 Ohm
Lead Channel Impedance Value: 532 Ohm
Lead Channel Impedance Value: 589 Ohm
Lead Channel Pacing Threshold Amplitude: 0.875 V
Lead Channel Pacing Threshold Pulse Width: 0.4 ms
Lead Channel Sensing Intrinsic Amplitude: 11 mV
Lead Channel Sensing Intrinsic Amplitude: 11.625 mV
Lead Channel Sensing Intrinsic Amplitude: 14.625 mV
Lead Channel Sensing Intrinsic Amplitude: 8.5 mV
Lead Channel Setting Pacing Amplitude: 2 V
Lead Channel Setting Pacing Amplitude: 2 V
Lead Channel Setting Pacing Pulse Width: 0.4 ms
Lead Channel Setting Sensing Sensitivity: 0.9 mV

## 2021-09-03 NOTE — Patient Instructions (Addendum)
Medication Instructions:  Your physician recommends that you continue on your current medications as directed. Please refer to the Current Medication list given to you today.  *If you need a refill on your cardiac medications before your next appointment, please call your pharmacy*  NO CHANGES TO YOUR MEDICATIONS.   Lab Work: None ordered.  If you have labs (blood work) drawn today and your tests are completely normal, you will receive your results only by: MyChart Message (if you have MyChart) OR A paper copy in the mail If you have any lab test that is abnormal or we need to change your treatment, we will call you to review the results.  Testing/Procedures:  Please schedule an Echocardiogram complete within the next few weeks.   Follow-Up:  You will follow up with Dr. Lewayne Bunting at [facility] on [address] after your Echocardiogram results.      Remote monitoring is used to monitor your Pacemaker from home. This monitoring reduces the number of office visits required to check your device to one time per year. It allows [place] to keep an eye on the functioning of your device to ensure it is working properly. You are scheduled for a device check from home on 10/23/21. You may send your transmission at any time that day. If you have a wireless device, the transmission will be sent automatically. After your physician reviews your transmission, you will receive a postcard with your next transmission date.  Important Information About Sugar

## 2021-09-03 NOTE — Progress Notes (Signed)
HPI [name] [name] returns today for followup. He is a pleasant 71 yo man with chronic atrial fib and symptomatic tachy-brady syndrome with syncope who underwent PPM insertion in Jan. 2019 due to long symptomatic pauses. The patient has done well in the interim. He has diastolic heart failure. He denies palpitations. He has previously had fairly difficult to control HTN. He has tried to be more careful with his salt intake though he admits to eating too much during Covid. 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 He denies palpitations.  Allergies  Allergen Reactions   Digoxin And Related Other (See Comments)    unknown   Diltiazem Hcl     Generalized Edema (intolerance)    Metoprolol Other (See Comments)    Fluid retention   Ouabain     Generalized Edema (intolerance)    Pantoprazole Sodium Other (See Comments)    Stomach pain   Propafenone Hcl Er Other (See Comments)    H/A, N/V, stomach upset     Current Outpatient Medications  Medication Sig Dispense Refill   aspirin 81 MG EC tablet Take 81 mg by mouth daily.     carvedilol (COREG CR) 20 MG 24 hr capsule Take 2 capsules (40 mg total) by mouth daily. 180 capsule 3   fluticasone (FLONASE) 50 MCG/ACT nasal spray Place 2 sprays into both nostrils daily as needed. ALLERGIES     furosemide (LASIX) 40 MG tablet Take 40 mg by mouth daily as needed (swelling).      hydrALAZINE (APRESOLINE) 25 MG tablet Take 25 mg by mouth daily.     hydrochlorothiazide (HYDRODIURIL) 25 MG tablet Take 25 mg by mouth daily.     hydrocortisone (ANUSOL-HC) 2.5 % rectal cream as needed.     isosorbide mononitrate (IMDUR) 30 MG 24 hr tablet Take 30 mg by mouth daily.     losartan (COZAAR) 100 MG tablet Take 1 tablet by mouth daily.     nitroGLYCERIN (NITROSTAT) 0.4 MG SL tablet Place 1 tablet (0.4 mg total) under the tongue as needed. 30 tablet 3   pantoprazole (PROTONIX) 40 MG tablet daily.     potassium chloride SA (K-DUR,KLOR-CON) 20 MEQ tablet Take 20  mEq by mouth daily.     RABEprazole (ACIPHEX) 20 MG tablet Take 20 mg by mouth daily.      rosuvastatin (CRESTOR) 10 MG tablet Take 10 mg by mouth daily as needed.     No current facility-administered medications for this visit.     Past Medical History:  Diagnosis Date   Arthritis    "all" (01/22/2017)   ATRIAL FIBRILLATION 06/26/2008   Qualifier: Diagnosis of  By: Ladona Ridgel, MD, [facility], [place]ergia Alcon    BRADYCARDIA 06/06/2008   Qualifier: Diagnosis of  By: Flonnie Overman     CAD (coronary artery disease), native coronary artery    a. Cath 02/2018 @ [facility] - 50-70% ostial Lcx; 50% diagonal; 25% ostial LAD; 25% mid LAD; 75% dLAD; 75% dRCA >>> medical therapy   CARDIOMYOPATHY, SECONDARY 06/06/2008   Qualifier: Diagnosis of  By: Flonnie Overman     chronic diastolic CHF    Essential hypertension 06/06/2008   Qualifier: Diagnosis of  By: Flonnie Overman     GERD (gastroesophageal reflux disease)    Heart murmur    High cholesterol    Migraine    "none in the 2000s" (01/22/2017)   PONV (postoperative nausea and vomiting)    Presence of permanent cardiac pacemaker    Presence  of Watchman left atrial appendage closure device 11/22/2020   27 mm Watchman FLX with Dr. 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   Past Surgical History:  Procedure Laterality Date   ATRIAL FIBRILLATION ABLATION     CARDIAC CATHETERIZATION     CARDIOVERSION  06/19/2005   DC cardioversion /notes 05/28/2010   INSERT / REPLACE / REMOVE PACEMAKER  01/22/2017   LEFT ATRIAL APPENDAGE OCCLUSION N/A 11/22/2020   Procedure: LEFT ATRIAL APPENDAGE OCCLUSION;  Surgeon: Lanier Prude, MD;  Location: [facility];  Service: Cardiovascular;  Laterality: N/A;   PACEMAKER IMPLANT N/A  01/22/2017   Procedure: PACEMAKER IMPLANT;  Surgeon: Marinus Maw, MD;  Location: [facility];  Service: Cardiovascular;  Laterality: N/A;   SHOULDER ARTHROSCOPY WITH ROTATOR CUFF REPAIR Left ~ 2015   TEE WITHOUT CARDIOVERSION N/A 11/22/2020   Procedure: TRANSESOPHAGEAL ECHOCARDIOGRAM (TEE);  Surgeon: Lanier Prude, MD;  Location: [facility];  Service: Cardiovascular;  Laterality: N/A;     History reviewed. No pertinent family history.   Social History   Socioeconomic History   Marital status: Married    Spouse name: Not on file   Number of children: Not on file   Years of education: Not on file   Highest education level: Not on file  Occupational History   Not on file  Tobacco Use   Smoking status: Former    Packs/day: 0.33    Years: 7.00    Total pack years: 2.31    Types: Cigarettes    Quit date: 39    Years since quitting: 48.6   Smokeless tobacco: Never  Vaping Use   Vaping Use: Never used  Substance and Sexual Activity   Alcohol use: No   Drug use: No   Sexual activity: Not Currently  Other Topics Concern   Not on file  Social History Narrative   Not on file   Social Determinants of Health   Financial Resource Strain: Not on file  Food Insecurity: Not on file  Transportation Needs: Not on file  Physical Activity: Not on file  Stress: Not on file  Social Connections: Not on file  Intimate Partner Violence: Not on file     BP 116/82   Pulse 67   Ht 5\' 11"  (1.803 m)   Wt 228 lb 12.8 oz (103.8 kg)   SpO2 96%   BMI 31.91 kg/m   Physical Exam:  Well appearing NAD HEENT: Unremarkable Neck:  No JVD, no thyromegally Lymphatics:  No adenopathy Back:  No CVA tenderness Lungs:  Clear with no wheezes HEART:  Regular rate rhythm, no murmurs, no rubs, no clicks Abd:  soft, positive bowel sounds, no organomegally, no rebound, no guarding Ext:  2 plus pulses, no edema, no cyanosis, no clubbing Skin:  No rashes no nodules Neuro:  CN  II through XII intact, motor grossly intact  EKG - atrial fib with ventricular pacing  DEVICE  Normal device function.  See PaceArt for details.   Assess/Plan:  1. Atrial fib - his rates are controlled. He is pacing about 35% of the time. He has undergone Watchman and is off of systemic anti-coagulation.  2. PPM - his medtronic DDD PM  has a His bundle as well as an RV lead, both of which are working normally.  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  4. Diastolic heart failure - his symptoms are class 2. He will continue his current meds.   Dorathy Daft.

## 2021-09-19 ENCOUNTER — Ambulatory Visit (HOSPITAL_COMMUNITY): Payer: Medicare PPO | Attending: Cardiovascular Disease

## 2021-09-19 DIAGNOSIS — Z950 Presence of cardiac pacemaker: Secondary | ICD-10-CM | POA: Insufficient documentation

## 2021-09-19 DIAGNOSIS — I456 Pre-excitation syndrome: Secondary | ICD-10-CM | POA: Diagnosis not present

## 2021-09-19 DIAGNOSIS — I4891 Unspecified atrial fibrillation: Secondary | ICD-10-CM | POA: Insufficient documentation

## 2021-09-19 LAB — ECHOCARDIOGRAM COMPLETE: S' Lateral: 3.9 cm

## 2021-09-26 ENCOUNTER — Telehealth: Payer: Self-pay

## 2021-09-26 NOTE — Telephone Encounter (Signed)
Pt Echo results were sent via MyChart.  Pt told to follow his POC, and follow up with [facility] with any questions or concerns.  No follow up required at this time.

## 2021-10-03 ENCOUNTER — Ambulatory Visit: Payer: Medicare PPO | Attending: Internal Medicine | Admitting: Internal Medicine

## 2021-10-03 ENCOUNTER — Encounter: Payer: Self-pay | Admitting: Internal Medicine

## 2021-10-03 VITALS — BP 124/78 | HR 70 | Ht 71.0 in | Wt 230.0 lb

## 2021-10-03 DIAGNOSIS — Z950 Presence of cardiac pacemaker: Secondary | ICD-10-CM

## 2021-10-03 DIAGNOSIS — I4891 Unspecified atrial fibrillation: Secondary | ICD-10-CM | POA: Diagnosis not present

## 2021-10-03 DIAGNOSIS — I456 Pre-excitation syndrome: Secondary | ICD-10-CM | POA: Diagnosis not present

## 2021-10-03 NOTE — Progress Notes (Signed)
HPI [name] returns today for followup. He is a pleasant 72 yo man with chronic atrial fib and symptomatic tachy-brady syndrome with syncope who underwent PPM insertion in Jan. 2019 due to long symptomatic pauses. The patient has done well in the interim. He has diastolic heart failure. He denies palpitations. He has previously had fairly difficult to control HTN. He has tried to be more careful with his salt intake though he admits to eating too much during Covid. He denies palpitations. When I saw him last he c/o some dyspnea and I asked him to undergo a 2D echo and he had very mild LV dysfunction. He feels better since his last visit.  Allergies  Allergen Reactions   Digoxin And Related Other (See Comments)    unknown   Diltiazem Hcl     Generalized Edema (intolerance)    Metoprolol Other (See Comments)    Fluid retention   Ouabain     Generalized Edema (intolerance)    Pantoprazole Sodium Other (See Comments)    Stomach pain   Propafenone Hcl Er Other (See Comments)    H/A, N/V, stomach upset     Current Outpatient Medications  Medication Sig Dispense Refill   aspirin 81 MG EC tablet Take 81 mg by mouth daily.     carvedilol (COREG CR) 20 MG 24 hr capsule Take 2 capsules (40 mg total) by mouth daily. 180 capsule 3   fluticasone (FLONASE) 50 MCG/ACT nasal spray Place 2 sprays into both nostrils daily as needed. ALLERGIES     furosemide (LASIX) 40 MG tablet Take 40 mg by mouth daily as needed (swelling).      hydrochlorothiazide (HYDRODIURIL) 25 MG tablet Take 25 mg by mouth daily.     hydrocortisone (ANUSOL-HC) 2.5 % rectal cream as needed.     isosorbide mononitrate (IMDUR) 30 MG 24 hr tablet Take 30 mg by mouth daily.     losartan (COZAAR) 100 MG tablet Take 1 tablet by mouth daily.     nitroGLYCERIN (NITROSTAT) 0.4 MG SL tablet Place 1 tablet (0.4 mg total) under the tongue as needed. 30 tablet 3   pantoprazole (PROTONIX) 40 MG tablet daily.     potassium chloride  SA (K-DUR,KLOR-CON) 20 MEQ tablet Take 20 mEq by mouth daily.     RABEprazole (ACIPHEX) 20 MG tablet Take 20 mg by mouth daily.      rosuvastatin (CRESTOR) 10 MG tablet Take 10 mg by mouth daily as needed.     hydrALAZINE (APRESOLINE) 25 MG tablet Take 25 mg by mouth daily. (Patient not taking: Reported on 10/03/2021)     No current facility-administered medications for this visit.     Past Medical History:  Diagnosis Date   Arthritis    "all" (01/22/2017)   ATRIAL FIBRILLATION 06/26/2008   Qualifier: Diagnosis of  By: Ladona Ridgel, MD, [facility], Vergia Alcon    BRADYCARDIA 06/06/2008   Qualifier: Diagnosis of  By: Flonnie Overman     CAD (coronary artery disease), native coronary artery    a. Cath 02/2018 @ [facility] - 50-70% ostial Lcx; 50% diagonal; 25% ostial LAD; 25% mid LAD; 75% dLAD; 75% dRCA >>> medical therapy   CARDIOMYOPATHY, SECONDARY 06/06/2008   Qualifier: Diagnosis of  By: Flonnie Overman     chronic diastolic CHF    Essential hypertension 06/06/2008   Qualifier: Diagnosis of  By: Flonnie Overman     GERD (gastroesophageal reflux disease)    Heart murmur    High cholesterol  Migraine    "none in the 2000s" (01/22/2017)   PONV (postoperative nausea and vomiting)    Presence of permanent cardiac pacemaker    Presence of Watchman left atrial appendage closure device 11/22/2020   27 mm Watchman FLX with Dr. 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   Past Surgical History:  Procedure Laterality Date   ATRIAL FIBRILLATION ABLATION     CARDIAC CATHETERIZATION     CARDIOVERSION  06/19/2005   DC cardioversion /notes 05/28/2010   INSERT / REPLACE / REMOVE PACEMAKER  01/22/2017   LEFT ATRIAL APPENDAGE OCCLUSION N/A 11/22/2020   Procedure: LEFT ATRIAL  APPENDAGE OCCLUSION;  Surgeon: Vickie Epley, MD;  Location: [place] [facility];  Service: Cardiovascular;  Laterality: N/A;   PACEMAKER IMPLANT N/A 01/22/2017   Procedure: PACEMAKER IMPLANT;  Surgeon: Evans Lance, MD;  Location: [place] [facility];  Service: Cardiovascular;  Laterality: N/A;   SHOULDER ARTHROSCOPY WITH ROTATOR CUFF REPAIR Left ~ 2015   TEE WITHOUT CARDIOVERSION N/A 11/22/2020   Procedure: TRANSESOPHAGEAL ECHOCARDIOGRAM (TEE);  Surgeon: Vickie Epley, MD;  Location: [place] [facility];  Service: Cardiovascular;  Laterality: N/A;     History reviewed. No pertinent family history.   Social History   Socioeconomic History   Marital status: Married    Spouse name: Not on file   Number of children: Not on file   Years of education: Not on file   Highest education level: Not on file  Occupational History   Not on file  Tobacco Use   Smoking status: Former    Packs/day: 0.33    Years: 7.00    Total pack years: 2.31    Types: Cigarettes    Quit date: 44    Years since quitting: 48.7   Smokeless tobacco: Never  Vaping Use   Vaping Use: Never used  Substance and Sexual Activity   Alcohol use: No   Drug use: No   Sexual activity: Not Currently  Other Topics Concern   Not on file  Social History Narrative   Not on file   Social Determinants of Health   Financial Resource Strain: Not on file  Food Insecurity: Not on file  Transportation Needs: Not on file  Physical Activity: Not on file  Stress: Not on file  Social Connections: Not on file  Intimate Partner Violence: Not on file     BP 124/78   Pulse 70   Ht 5\' 11"  (1.803 m)   Wt 230 lb (104.3 kg)   SpO2 98%   BMI 32.08 kg/m   Physical Exam:  Well appearing NAD HEENT: Unremarkable Neck:  No JVD, no thyromegally Lymphatics:  No adenopathy Back:  No CVA tenderness Lungs:  Clear with no wheezes HEART:  Regular rate rhythm, no murmurs, no rubs, no clicks Abd:  soft, positive bowel  sounds, no organomegally, no rebound, no guarding Ext:  2 plus pulses, no edema, no cyanosis, no clubbing Skin:  No rashes no nodules Neuro:  CN II through XII intact, motor grossly intact  DEVICE  Normal device function.  See PaceArt for details.   Assess/Plan:   1. Atrial fib - his rates are controlled. He is pacing about 35% of the  time. He has undergone Watchman and is off of systemic anti-coagulation.  2. 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  4. Diastolic heart failure - his symptoms are class 2. He will continue his current meds. His EF is down slightly. He is feeling better. Might consider adding entresto and stopping losartan in the future.   Loman Chroman Nathanal Hermiz,MD

## 2021-10-03 NOTE — Patient Instructions (Addendum)
Medication Instructions:  Your physician recommends that you continue on your current medications as directed. Please refer to the Current Medication list given to you today.  *If you need a refill on your cardiac medications before your next appointment, please call your pharmacy*  Lab Work: None ordered.  If you have labs (blood work) drawn today and your tests are completely normal, you will receive your results only by: [place] (if you have MyChart) OR A paper copy in the mail If you have any lab test that is abnormal or we need to change your treatment, we will call you to review the results.  Testing/Procedures: None ordered.  Follow-Up: At [facility], you and your health needs are our priority.  As part of our continuing mission to provide you with exceptional heart care, we have created designated Provider Care Teams.  These Care Teams include your primary Cardiologist (physician) and Advanced Practice Providers (APPs -  Physician Assistants and Nurse Practitioners) who all work together to provide you with the care you need, when you need it.  We recommend signing up for the patient portal called "MyChart".  Sign up information is provided on this After Visit Summary.  MyChart is used to connect with patients for Virtual Visits (Telemedicine).  Patients are able to view lab/test results, encounter notes, upcoming appointments, etc.  Non-urgent messages can be sent to your provider as well.   To learn more about what you can do with MyChart, go to [email or web address].    Your next appointment:   Please schedule a follow up appointment with Dr. Cristopher Peru for next July.  The format for your next appointment:   In Person  Provider:   Cristopher Peru, MD{or one of the following Advanced Practice Providers on your designated Care Team:   Tommye Standard, [place] Legrand Como "Jon[facility]" [place] Cater, [place]  Remote monitoring is used to monitor your Pacemaker from home. This monitoring  reduces the number of office visits required to check your device to one time per year. It allows [place] to keep an eye on the functioning of your device to ensure it is working properly. You are scheduled for a device check from home on 10/23/2021. You may send your transmission at any time that day. If you have a wireless device, the transmission will be sent automatically. After your physician reviews your transmission, you will receive a postcard with your next transmission date.  Important Information About Sugar

## 2021-10-23 ENCOUNTER — Ambulatory Visit (INDEPENDENT_AMBULATORY_CARE_PROVIDER_SITE_OTHER): Payer: Medicare PPO

## 2021-10-23 DIAGNOSIS — I442 Atrioventricular block, complete: Secondary | ICD-10-CM | POA: Diagnosis not present

## 2021-10-23 LAB — CUP PACEART REMOTE DEVICE CHECK
Battery Remaining Longevity: 118 mo
Battery Voltage: 3 V
Brady Statistic AP VP Percent: 0.02 %
Brady Statistic AP VS Percent: 42.24 %
Brady Statistic AS VP Percent: 0 %
Brady Statistic AS VS Percent: 57.74 %
Brady Statistic RA Percent Paced: 41.54 %
Brady Statistic RV Percent Paced: 0.02 %
Date Time Interrogation Session: 20231011065330
Implantable Lead Implant Date: 20190110
Implantable Lead Implant Date: 20190110
Implantable Lead Location: 753860
Implantable Lead Location: 753860
Implantable Lead Model: 3830
Implantable Lead Model: 5076
Implantable Pulse Generator Implant Date: 20190110
Lead Channel Impedance Value: 380 Ohm
Lead Channel Impedance Value: 475 Ohm
Lead Channel Impedance Value: 532 Ohm
Lead Channel Impedance Value: 532 Ohm
Lead Channel Pacing Threshold Amplitude: 0.875 V
Lead Channel Pacing Threshold Pulse Width: 0.4 ms
Lead Channel Sensing Intrinsic Amplitude: 13.125 mV
Lead Channel Sensing Intrinsic Amplitude: 13.125 mV
Lead Channel Sensing Intrinsic Amplitude: 9.125 mV
Lead Channel Sensing Intrinsic Amplitude: 9.125 mV
Lead Channel Setting Pacing Amplitude: 2 V
Lead Channel Setting Pacing Amplitude: 2 V
Lead Channel Setting Pacing Pulse Width: 0.4 ms
Lead Channel Setting Sensing Sensitivity: 0.9 mV

## 2021-11-05 NOTE — Progress Notes (Signed)
Remote pacemaker transmission.   

## 2022-01-22 ENCOUNTER — Ambulatory Visit (INDEPENDENT_AMBULATORY_CARE_PROVIDER_SITE_OTHER): Payer: Medicare PPO

## 2022-01-22 DIAGNOSIS — I442 Atrioventricular block, complete: Secondary | ICD-10-CM

## 2022-01-22 LAB — CUP PACEART REMOTE DEVICE CHECK
Battery Remaining Longevity: 115 mo
Battery Voltage: 3 V
Brady Statistic AP VP Percent: 0.02 %
Brady Statistic AP VS Percent: 38.32 %
Brady Statistic AS VP Percent: 0 %
Brady Statistic AS VS Percent: 61.66 %
Brady Statistic RA Percent Paced: 37.62 %
Brady Statistic RV Percent Paced: 0.02 %
Date Time Interrogation Session: 20240109210047
Implantable Lead Connection Status: 753985
Implantable Lead Connection Status: 753985
Implantable Lead Implant Date: 20190110
Implantable Lead Implant Date: 20190110
Implantable Lead Location: 753860
Implantable Lead Location: 753860
Implantable Lead Model: 3830
Implantable Lead Model: 5076
Implantable Pulse Generator Implant Date: 20190110
Lead Channel Impedance Value: 361 Ohm
Lead Channel Impedance Value: 532 Ohm
Lead Channel Impedance Value: 551 Ohm
Lead Channel Impedance Value: 608 Ohm
Lead Channel Pacing Threshold Amplitude: 0.875 V
Lead Channel Pacing Threshold Pulse Width: 0.4 ms
Lead Channel Sensing Intrinsic Amplitude: 13.125 mV
Lead Channel Sensing Intrinsic Amplitude: 13.125 mV
Lead Channel Sensing Intrinsic Amplitude: 9.75 mV
Lead Channel Sensing Intrinsic Amplitude: 9.75 mV
Lead Channel Setting Pacing Amplitude: 2 V
Lead Channel Setting Pacing Amplitude: 2 V
Lead Channel Setting Pacing Pulse Width: 0.4 ms
Lead Channel Setting Sensing Sensitivity: 0.9 mV
Zone Setting Status: 755011
Zone Setting Status: 755011

## 2022-02-13 NOTE — Progress Notes (Signed)
Remote pacemaker transmission.   

## 2022-04-22 IMAGING — CT CT HEART MORPH/PULM VEIN W/ CM & W/O CA SCORE
1 series · 12 of 20 positions shown, 15 images · non-contrast
Comparison: Prior study on 10/18/2020
COMPARISON: Prior study on 10/18/2020

Addendum:
EXAM:
OVER-READ INTERPRETATION  CT CHEST

The following report is an over-read performed by radiologist Dr.
Adombire Tienaah [REDACTED] on 01/08/2021. This
over-read does not include interpretation of cardiac or coronary
anatomy or pathology. The cardiac CTA interpretation by the
cardiologist is attached.
CLINICAL DATA: Post left atrial appendage closure evaluation. 27
Khuzema Rhaman 11/22/2020.
Cardiac CT/CTA
TECHNIQUE: A non-contrast, gated CT scan was obtained with axial slices of 3 mm
through the heart for calcium scoring. Calcium scoring was performed
using the Agatston method. A 130 kV prospective, gated, contrast
cardiac scan was obtained. Gantry rotation speed was 250 msecs and
collimation was 0.6 mm. Nitroglycerin was not given. A delayed scan
was obtained to exclude left atrial appendage thrombus. The 3D
dataset was reconstructed in 5% intervals of the 25-50% of the R-R
cycle. Late systolic phases were analyzed on a dedicated workstation
using MPR, MIP, and VRT modes. The patient received 80 cc of
contrast.

[Series 963: findings · 0.52mm/px · 12 of 30 slices shown, 15 images]
[im 2/30  vessel]
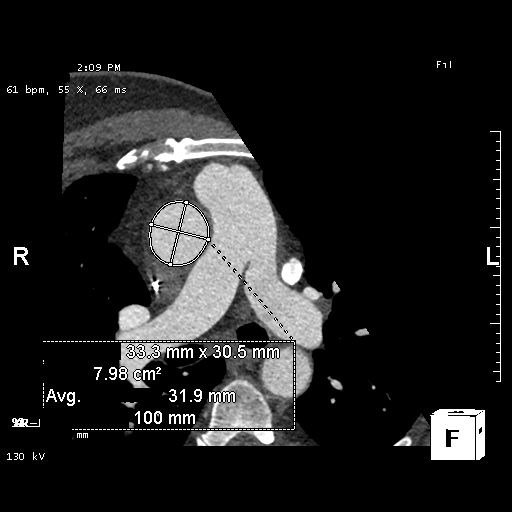
[im 2/30  lung]
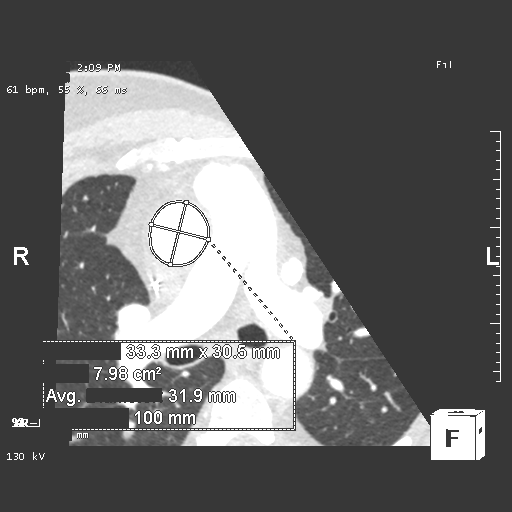
[im 5/30  vessel]
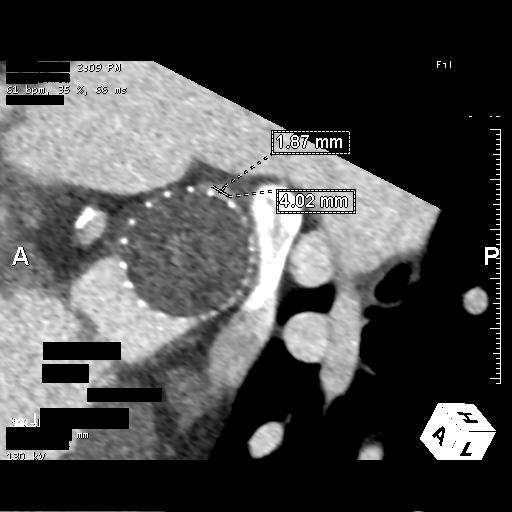
[im 7/30  vessel]
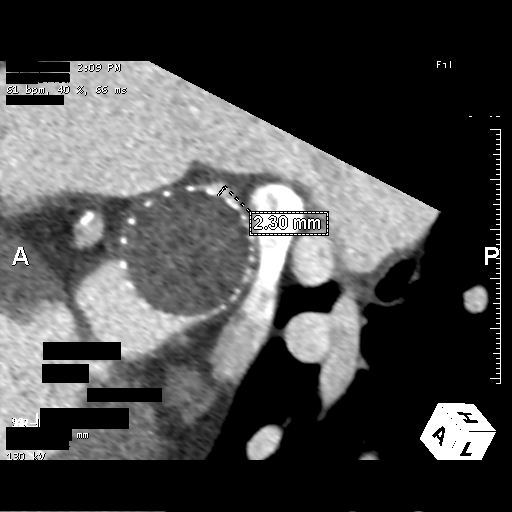
[im 10/30  vessel]
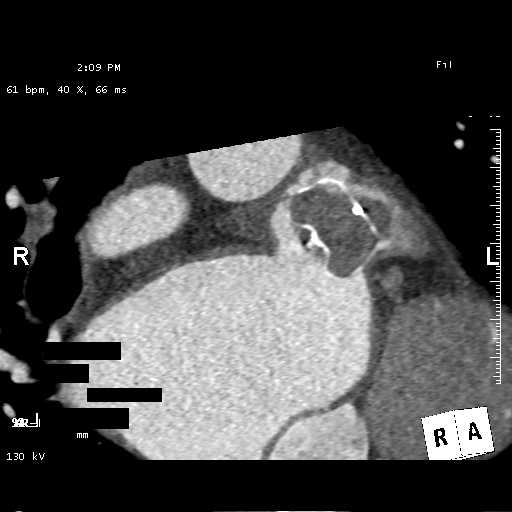
[im 11/30  vessel]
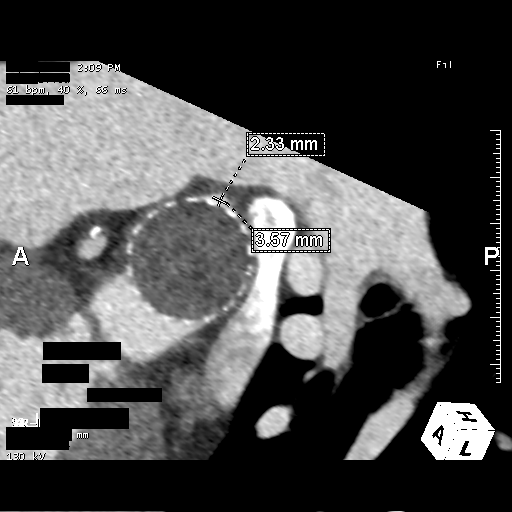
[im 11/30  lung]
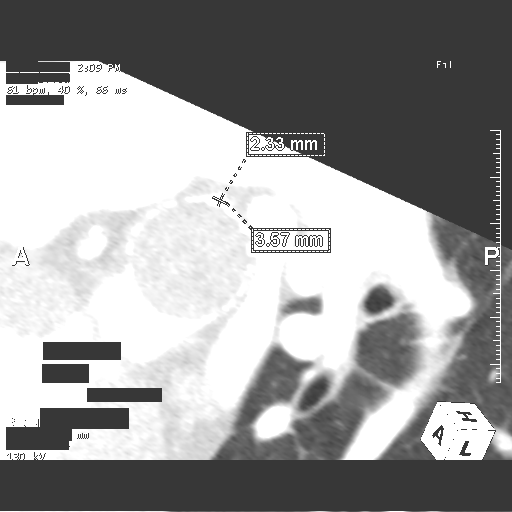
[im 14/30  vessel]
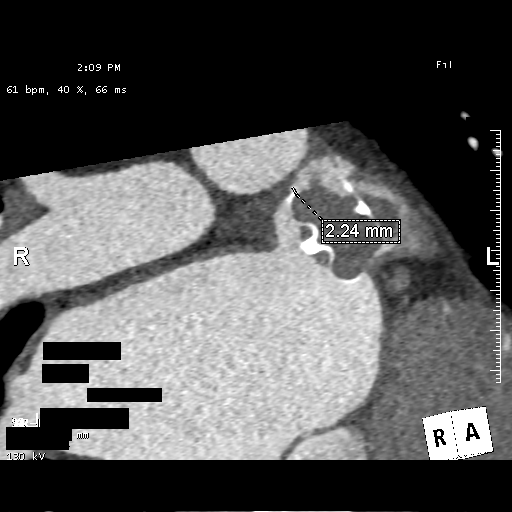
[im 16/30  vessel]
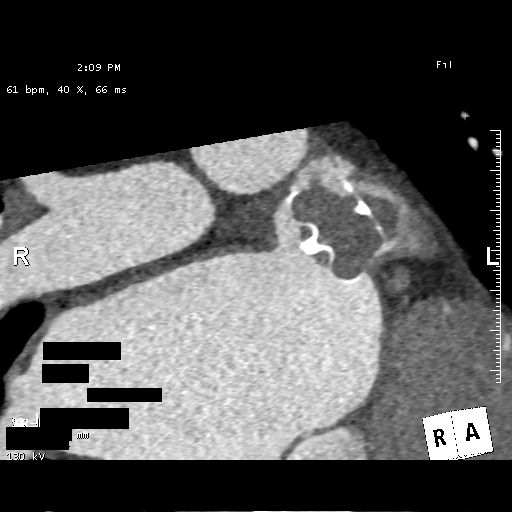
[im 19/30  vessel]
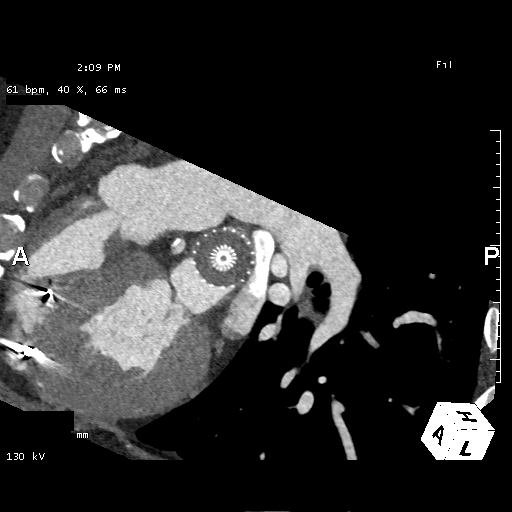
[im 20/30  vessel]
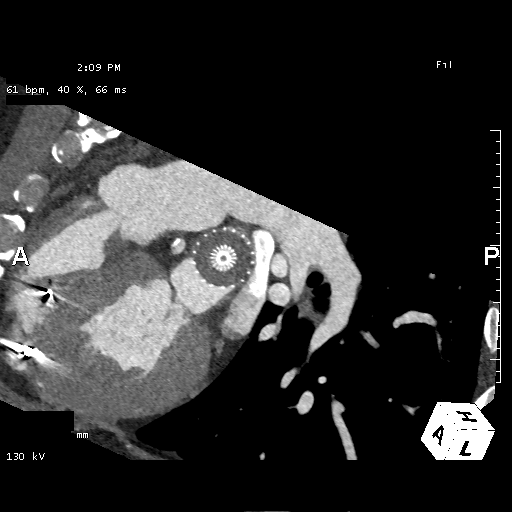
[im 20/30  lung]
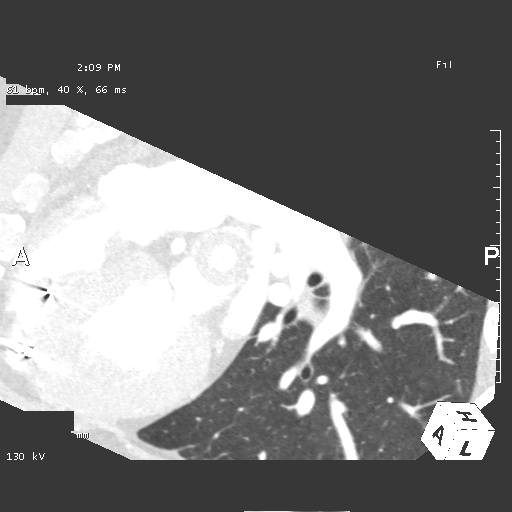
[im 23/30  vessel]
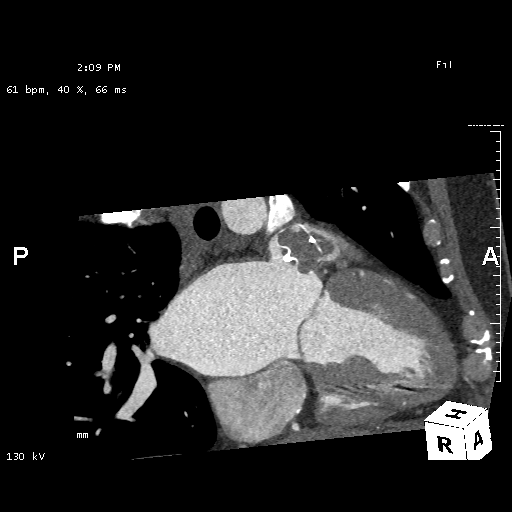
[im 25/30  vessel]
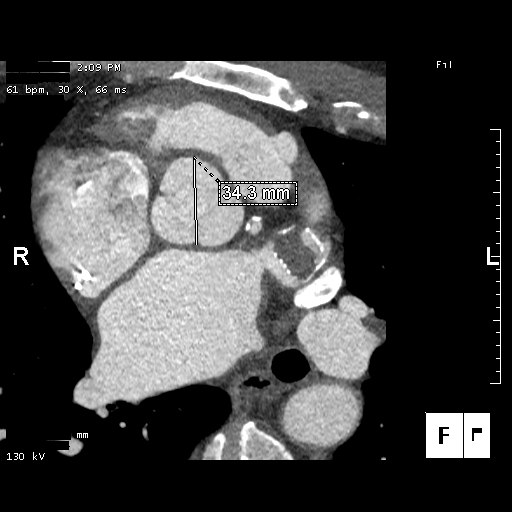
[im 28/30  vessel]
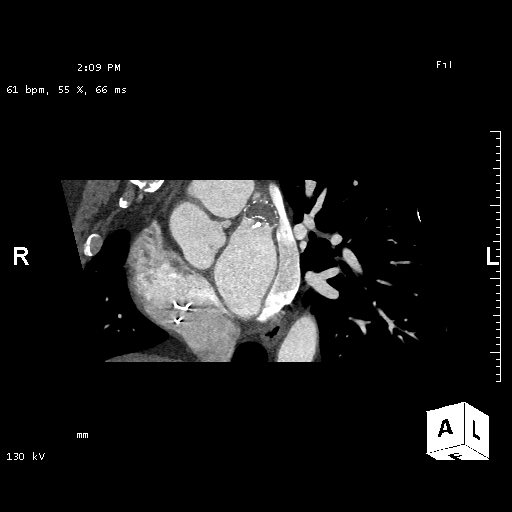

[12 of 20 positions shown; findings below may reference images not displayed]

FINDINGS: Vascular: Stable evidence of dilated central pulmonary arteries with
the main pulmonary artery measuring approximately 3.2 cm.

Mediastinum/Nodes: Visualized mediastinum and hilar regions
demonstrate no lymphadenopathy or masses.

Lungs/Pleura: Visualized lungs show no evidence of pulmonary edema,
consolidation, pneumothorax, nodule or pleural fluid.

Upper Abdomen: No acute abnormality.

Musculoskeletal: No chest wall mass or suspicious bone lesions
identified.
IMPRESSION: Stable dilated central pulmonary arteries suggestive of underlying
pulmonary hypertension.
FINDINGS: Image quality: Excellent.

Noise artifact is: Limited.

Left Atrium: The left atrial size is dilated. There is no PFO/ASD.
There is normal pulmonary vein drainage into the left atrium (3 on
the right and 2 on the left).

Left Atrial Appendage: A 27 mm Khuzema Rhaman device is present in the
left atrial appendage. There is no evidence of device related
thrombus on the outer frame of the device. There is evidence of
appropriate endothelialization of the outer frame. There is
appropriate thrombus within the device and behind the device in the
OHEMAAH. There is a small device leak in the superior position that
measures 2.3 mm x 3.6 mm in its greatest dimension. The leak is
close to a superior accessory lobe, but still within limits for a
successful closure. There is a shoulder inferiorly between the LAAO
device and the mitral valve, but this is also within limits. The
device appears stable on systolic imaging without movement.

Venous drainage: A persistent left SVC is present. Dilated coronary
sinus.

Coronary Arteries: Normal coronary origin. Left dominance. The study
was performed without use of NTG and is insufficient for plaque
evaluation. 3-vessel coronary calcifications noted.

Right Atrium: Right atrial size is dilated. Pacemaker leads are
present in the RA/RV.

Right Ventricle: The right ventricular cavity is within normal
limits.

Left Ventricle: The ventricular cavity size is within normal limits.
There are no stigmata of prior infarction. There is no abnormal
filling defect.

Pulmonary Artery: Dilated suggestive of pulmonary hypertension.

Cardiac valves: The aortic valve is trileaflet without significant
calcification. The mitral valve is normal structure without
significant calcification.

Aorta: Normal caliber with no significant disease.

Pericardium: Normal thickness with no significant effusion or
calcium present.

Extra-cardiac findings: See attached radiology report for
non-cardiac structures.
IMPRESSION: 1. 27 mm Khuzema Rhaman with small device leak in the superior aspect
of the appendage (2.3 mm x 3.6 mm).

2. No evidence of device related thrombus. Appropriate thrombus is
present behind the device and in the OHEMAAH.

3. Persistent left SVC with dilated coronary sinus. No evidence of
unroofed coronary sinus.

4. Dilated pulmonary artery suggest of pulmonary hypertension.

*** End of Addendum ***
EXAM:
OVER-READ INTERPRETATION  CT CHEST

The following report is an over-read performed by radiologist Dr.
Adombire Tienaah [REDACTED] on 01/08/2021. This
over-read does not include interpretation of cardiac or coronary
anatomy or pathology. The cardiac CTA interpretation by the
cardiologist is attached.
FINDINGS: Vascular: Stable evidence of dilated central pulmonary arteries with
the main pulmonary artery measuring approximately 3.2 cm.

Mediastinum/Nodes: Visualized mediastinum and hilar regions
demonstrate no lymphadenopathy or masses.

Lungs/Pleura: Visualized lungs show no evidence of pulmonary edema,
consolidation, pneumothorax, nodule or pleural fluid.

Upper Abdomen: No acute abnormality.

Musculoskeletal: No chest wall mass or suspicious bone lesions
identified.
IMPRESSION: Stable dilated central pulmonary arteries suggestive of underlying
pulmonary hypertension.

## 2022-04-23 ENCOUNTER — Ambulatory Visit (INDEPENDENT_AMBULATORY_CARE_PROVIDER_SITE_OTHER): Payer: Medicare PPO

## 2022-04-23 DIAGNOSIS — I442 Atrioventricular block, complete: Secondary | ICD-10-CM

## 2022-04-23 LAB — CUP PACEART REMOTE DEVICE CHECK
Battery Remaining Longevity: 112 mo
Battery Voltage: 3 V
Brady Statistic AP VP Percent: 0.02 %
Brady Statistic AP VS Percent: 40.37 %
Brady Statistic AS VP Percent: 0 %
Brady Statistic AS VS Percent: 59.6 %
Brady Statistic RA Percent Paced: 39.67 %
Brady Statistic RV Percent Paced: 0.02 %
Date Time Interrogation Session: 20240409233540
Implantable Lead Connection Status: 753985
Implantable Lead Connection Status: 753985
Implantable Lead Implant Date: 20190110
Implantable Lead Implant Date: 20190110
Implantable Lead Location: 753860
Implantable Lead Location: 753860
Implantable Lead Model: 3830
Implantable Lead Model: 5076
Implantable Pulse Generator Implant Date: 20190110
Lead Channel Impedance Value: 361 Ohm
Lead Channel Impedance Value: 437 Ohm
Lead Channel Impedance Value: 494 Ohm
Lead Channel Impedance Value: 513 Ohm
Lead Channel Pacing Threshold Amplitude: 0.875 V
Lead Channel Pacing Threshold Pulse Width: 0.4 ms
Lead Channel Sensing Intrinsic Amplitude: 12.375 mV
Lead Channel Sensing Intrinsic Amplitude: 12.375 mV
Lead Channel Sensing Intrinsic Amplitude: 9.125 mV
Lead Channel Sensing Intrinsic Amplitude: 9.125 mV
Lead Channel Setting Pacing Amplitude: 2 V
Lead Channel Setting Pacing Amplitude: 2 V
Lead Channel Setting Pacing Pulse Width: 0.4 ms
Lead Channel Setting Sensing Sensitivity: 0.9 mV
Zone Setting Status: 755011
Zone Setting Status: 755011

## 2022-05-30 NOTE — Progress Notes (Signed)
Remote pacemaker transmission.   

## 2022-07-23 ENCOUNTER — Ambulatory Visit (INDEPENDENT_AMBULATORY_CARE_PROVIDER_SITE_OTHER): Payer: Medicare PPO

## 2022-07-23 DIAGNOSIS — I442 Atrioventricular block, complete: Secondary | ICD-10-CM

## 2022-07-23 LAB — CUP PACEART REMOTE DEVICE CHECK
Battery Remaining Longevity: 110 mo
Battery Voltage: 2.99 V
Brady Statistic AP VP Percent: 0.02 %
Brady Statistic AP VS Percent: 33.47 %
Brady Statistic AS VP Percent: 0 %
Brady Statistic AS VS Percent: 66.51 %
Brady Statistic RA Percent Paced: 32.8 %
Brady Statistic RV Percent Paced: 0.02 %
Date Time Interrogation Session: 20240710032127
Implantable Lead Connection Status: 753985
Implantable Lead Connection Status: 753985
Implantable Lead Implant Date: 20190110
Implantable Lead Implant Date: 20190110
Implantable Lead Location: 753860
Implantable Lead Location: 753860
Implantable Lead Model: 3830
Implantable Lead Model: 5076
Implantable Pulse Generator Implant Date: 20190110
Lead Channel Impedance Value: 323 Ohm
Lead Channel Impedance Value: 418 Ohm
Lead Channel Impedance Value: 475 Ohm
Lead Channel Impedance Value: 494 Ohm
Lead Channel Pacing Threshold Amplitude: 0.875 V
Lead Channel Pacing Threshold Pulse Width: 0.4 ms
Lead Channel Sensing Intrinsic Amplitude: 11.375 mV
Lead Channel Sensing Intrinsic Amplitude: 11.375 mV
Lead Channel Sensing Intrinsic Amplitude: 7.875 mV
Lead Channel Sensing Intrinsic Amplitude: 7.875 mV
Lead Channel Setting Pacing Amplitude: 2 V
Lead Channel Setting Pacing Amplitude: 2 V
Lead Channel Setting Pacing Pulse Width: 0.4 ms
Lead Channel Setting Sensing Sensitivity: 0.9 mV
Zone Setting Status: 755011
Zone Setting Status: 755011

## 2022-08-13 NOTE — Progress Notes (Signed)
Remote pacemaker transmission.   

## 2022-09-08 ENCOUNTER — Ambulatory Visit: Payer: Medicare PPO | Attending: Internal Medicine | Admitting: Internal Medicine

## 2022-09-08 ENCOUNTER — Encounter: Payer: Self-pay | Admitting: Internal Medicine

## 2022-09-08 VITALS — BP 142/84 | HR 65 | Ht 70.0 in | Wt 231.0 lb

## 2022-09-08 DIAGNOSIS — I442 Atrioventricular block, complete: Secondary | ICD-10-CM

## 2022-09-08 NOTE — Patient Instructions (Signed)
Medication Instructions:  Your physician recommends that you continue on your current medications as directed. Please refer to the Current Medication list given to you today.  *If you need a refill on your cardiac medications before your next appointment, please call your pharmacy*  Lab Work: None ordered.  If you have labs (blood work) drawn today and your tests are completely normal, you will receive your results only by: MyChart Message (if you have MyChart) OR A paper copy in the mail If you have any lab test that is abnormal or we need to change your treatment, we will call you to review the results.  Testing/Procedures: None ordered.  Follow-Up: At [facility], you and your health needs are our priority.  As part of our continuing mission to provide you with exceptional heart care, we have created designated Provider Care Teams.  These Care Teams include your primary Cardiologist (physician) and Advanced Practice Providers (APPs -  Physician Assistants and Nurse Practitioners) who all work together to provide you with the care you need, when you need it.  Your next appointment:   1 year(s)  The format for your next appointment:   In Person  Provider:   Lewayne Bunting, MD{or one of the following Advanced Practice Providers on your designated Care Team:   Francis Dowse, [place] Casimiro Needle "Mardelle Matte" [place], [place] Earnest Rosier, NP  Remote monitoring is used to monitor your Pacemaker/ ICD from home. This monitoring reduces the number of office visits required to check your device to one time per year. It allows [place] to keep an eye on the functioning of your device to ensure it is working properly.   Important Information About Sugar

## 2022-09-08 NOTE — Progress Notes (Signed)
HPI [name] returns today for followup. He is a pleasant 72 yo man with chronic atrial fib and symptomatic tachy-brady syndrome with syncope who underwent PPM insertion in Jan. 2019 due to long symptomatic pauses. The patient has done well in the interim. He has diastolic heart failure. He denies palpitations. He has previously had fairly difficult to control HTN. He has tried to be more careful with his salt intake though he admits to eating too much during Covid. He denies palpitations. When I saw him last he c/o some dyspnea and I asked him to undergo a 2D echo and he had very mild LV dysfunction. He feels better since his last visit.  Allergies  Allergen Reactions   Digoxin And Related Other (See Comments)    unknown   Diltiazem Hcl     Generalized Edema (intolerance)    Metoprolol Other (See Comments)    Fluid retention   Ouabain     Generalized Edema (intolerance)    Pantoprazole Sodium Other (See Comments)    Stomach pain   Propafenone Hcl Er Other (See Comments)    H/A, N/V, stomach upset     Current Outpatient Medications  Medication Sig Dispense Refill   aspirin 81 MG EC tablet Take 81 mg by mouth daily.     carvedilol (COREG CR) 20 MG 24 hr capsule Take 2 capsules (40 mg total) by mouth daily. 180 capsule 3   fluticasone (FLONASE) 50 MCG/ACT nasal spray Place 2 sprays into both nostrils daily as needed. ALLERGIES     furosemide (LASIX) 40 MG tablet Take 40 mg by mouth daily as needed (swelling).      hydrALAZINE (APRESOLINE) 25 MG tablet Take 25 mg by mouth daily.     hydrochlorothiazide (HYDRODIURIL) 25 MG tablet Take 25 mg by mouth daily.     hydrocortisone (ANUSOL-HC) 2.5 % rectal cream as needed.     isosorbide mononitrate (IMDUR) 30 MG 24 hr tablet Take 30 mg by mouth daily.     losartan (COZAAR) 100 MG tablet Take 1 tablet by mouth daily.     nitroGLYCERIN (NITROSTAT) 0.4 MG SL tablet Place 1 tablet (0.4 mg total) under the tongue as needed. 30 tablet 3    pantoprazole (PROTONIX) 40 MG tablet daily.     potassium chloride SA (K-DUR,KLOR-CON) 20 MEQ tablet Take 20 mEq by mouth daily.     RABEprazole (ACIPHEX) 20 MG tablet Take 20 mg by mouth daily.      No current facility-administered medications for this visit.     Past Medical History:  Diagnosis Date   Arthritis    "all" (01/22/2017)   ATRIAL FIBRILLATION 06/26/2008   Qualifier: Diagnosis of  By: Ladona Ridgel, MD, [facility], [place]    BRADYCARDIA 06/06/2008   Qualifier: Diagnosis of  By: Flonnie Overman     CAD (coronary artery disease), native coronary artery    a. Cath 02/2018 @ [facility] - 50-70% ostial Lcx; 50% diagonal; 25% ostial LAD; 25% mid LAD; 75% dLAD; 75% dRCA >>> medical therapy   CARDIOMYOPATHY, SECONDARY 06/06/2008   Qualifier: Diagnosis of  By: Flonnie Overman     chronic diastolic CHF    Essential hypertension 06/06/2008   Qualifier: Diagnosis of  By: Flonnie Overman     GERD (gastroesophageal reflux disease)    Heart murmur    High cholesterol    Migraine    "none in the 2000s" (01/22/2017)   PONV (postoperative nausea and vomiting)    Presence  of permanent cardiac pacemaker    Presence of Watchman left atrial appendage closure device 11/22/2020   27 mm Watchman FLX with Dr. 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   Past Surgical History:  Procedure Laterality Date   ATRIAL FIBRILLATION ABLATION     CARDIAC CATHETERIZATION     CARDIOVERSION  06/19/2005   DC cardioversion /notes 05/28/2010   INSERT / REPLACE / REMOVE PACEMAKER  01/22/2017   LEFT ATRIAL APPENDAGE OCCLUSION N/A 11/22/2020   Procedure: LEFT ATRIAL APPENDAGE OCCLUSION;  Surgeon: Lanier Prude, MD;  Location: [facility] INVASIVE CV LAB;  Service: Cardiovascular;   Laterality: N/A;   PACEMAKER IMPLANT N/A 01/22/2017   Procedure: PACEMAKER IMPLANT;  Surgeon: Marinus Maw, MD;  Location: [facility] INVASIVE CV LAB;  Service: Cardiovascular;  Laterality: N/A;   SHOULDER ARTHROSCOPY WITH ROTATOR CUFF REPAIR Left ~ 2015   TEE WITHOUT CARDIOVERSION N/A 11/22/2020   Procedure: TRANSESOPHAGEAL ECHOCARDIOGRAM (TEE);  Surgeon: Lanier Prude, MD;  Location: [facility] INVASIVE CV LAB;  Service: Cardiovascular;  Laterality: N/A;     History reviewed. No pertinent family history.   Social History   Socioeconomic History   Marital status: Married    Spouse name: Not on file   Number of children: Not on file   Years of education: Not on file   Highest education level: Not on file  Occupational History   Not on file  Tobacco Use   Smoking status: Former    Current packs/day: 0.00    Average packs/day: 0.3 packs/day for 7.0 years (2.3 ttl pk-yrs)    Types: Cigarettes    Start date: 63    Quit date: 68    Years since quitting: 49.6   Smokeless tobacco: Never  Vaping Use   Vaping status: Never Used  Substance and Sexual Activity   Alcohol use: No   Drug use: No   Sexual activity: Not Currently  Other Topics Concern   Not on file  Social History Narrative   Not on file   Social Determinants of Health   Financial Resource Strain: Low Risk  (07/03/2020)   Received from [facility], [facility]   Overall Financial Resource Strain (CARDIA)    Difficulty of Paying Living Expenses: Not hard at all  Food Insecurity: No Food Insecurity (01/15/2021)   Received from [facility], [facility]   Hunger Vital Sign    Worried About Running Out of Food in the Last Year: Never true    Ran Out of Food in the Last Year: Never true  Transportation Needs: No Transportation Needs (07/03/2020)   Received from [facility], [facility]   PRAPARE - Transportation    Lack of Transportation (Medical): No    Lack of Transportation (Non-Medical): No  Physical  Activity: Sufficiently Active (07/03/2020)   Received from [facility], [facility]   Exercise Vital Sign    Days of Exercise per Week: 3 days    Minutes of Exercise per Session: 60 min  Stress: No Stress Concern Present (07/03/2020)   Received from [facility], [facility] of [facility]ational [facility] - Occupational Stress Questionnaire    Feeling of Stress : Only a little  Social Connections: Unknown (05/13/2021)  Received from [facility], [facility]   Social Network    Social Network: Not on file  Intimate Partner Violence: Unknown (04/16/2021)   Received from [facility], [facility]   HITS    Physically Hurt: Not on file    Insult or Talk Down To: Not on file    Threaten Physical Harm: Not on file    Scream or Curse: Not on file     BP (!) 142/84   Pulse 65   Ht 5\' 10"  (1.778 m)   Wt 231 lb (104.8 kg)   SpO2 99%   BMI 33.15 kg/m   Physical Exam:  Well appearing NAD HEENT: Unremarkable Neck:  No JVD, no thyromegally Lymphatics:  No adenopathy Back:  No CVA tenderness Lungs:  Clear with no wheezes HEART:  IRegular rate rhythm, no murmurs, no rubs, no clicks Abd:  soft, positive bowel sounds, no organomegally, no rebound, no guarding Ext:  2 plus pulses, no edema, no cyanosis, no clubbing Skin:  No rashes no nodules Neuro:  CN II through XII intact, motor grossly intact  EKG - atrial fib with ventricular pacing and sensing  DEVICE  Normal device function.  See PaceArt for details.   Assess/Plan:  1. Atrial fib - his rates are controlled. He is pacing about 35% of the time. He has undergone Watchman and is off of systemic anti-coagulation.  2. 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  4. Diastolic heart failure - his symptoms are class 2. He will continue his current meds. His EF is down slightly. He is feeling better.  Might consider adding entresto and stopping losartan in the future.   Sharlot Gowda Alice Vitelli,MD

## 2022-10-22 ENCOUNTER — Ambulatory Visit (INDEPENDENT_AMBULATORY_CARE_PROVIDER_SITE_OTHER): Payer: Medicare PPO

## 2022-10-22 DIAGNOSIS — I442 Atrioventricular block, complete: Secondary | ICD-10-CM | POA: Diagnosis not present

## 2022-10-22 LAB — CUP PACEART REMOTE DEVICE CHECK
Battery Remaining Longevity: 107 mo
Battery Voltage: 2.99 V
Brady Statistic AP VP Percent: 0.02 %
Brady Statistic AP VS Percent: 42.29 %
Brady Statistic AS VP Percent: 0 %
Brady Statistic AS VS Percent: 57.69 %
Brady Statistic RA Percent Paced: 41.57 %
Brady Statistic RV Percent Paced: 0.02 %
Date Time Interrogation Session: 20241009055408
Implantable Lead Connection Status: 753985
Implantable Lead Connection Status: 753985
Implantable Lead Implant Date: 20190110
Implantable Lead Implant Date: 20190110
Implantable Lead Location: 753860
Implantable Lead Location: 753860
Implantable Lead Model: 3830
Implantable Lead Model: 5076
Implantable Pulse Generator Implant Date: 20190110
Lead Channel Impedance Value: 342 Ohm
Lead Channel Impedance Value: 494 Ohm
Lead Channel Impedance Value: 513 Ohm
Lead Channel Impedance Value: 589 Ohm
Lead Channel Pacing Threshold Amplitude: 0.875 V
Lead Channel Pacing Threshold Pulse Width: 0.4 ms
Lead Channel Sensing Intrinsic Amplitude: 12.125 mV
Lead Channel Sensing Intrinsic Amplitude: 12.125 mV
Lead Channel Sensing Intrinsic Amplitude: 8 mV
Lead Channel Sensing Intrinsic Amplitude: 8 mV
Lead Channel Setting Pacing Amplitude: 2 V
Lead Channel Setting Pacing Amplitude: 2 V
Lead Channel Setting Pacing Pulse Width: 0.4 ms
Lead Channel Setting Sensing Sensitivity: 0.9 mV
Zone Setting Status: 755011
Zone Setting Status: 755011

## 2022-11-11 NOTE — Progress Notes (Signed)
Remote pacemaker transmission.   

## 2022-11-20 ENCOUNTER — Telehealth: Payer: Self-pay

## 2022-11-20 NOTE — Telephone Encounter (Signed)
Called to check in with patient, who had LAAO on 11/22/2020. The patient reports doing well with no issues.  The patient understands to call with questions or concerns.  He was grateful for check-in.

## 2022-12-08 ENCOUNTER — Telehealth: Payer: Self-pay

## 2022-12-08 NOTE — Telephone Encounter (Signed)
Reviewed patient's transmission. Presenting is (permanent Afib) VP at 60bpm.  (Patient has RV his bundle lead in Atrial port).  Also has a back RV lead in apex.  2 short episodes of NSVT versus possible short run of AF/RVR. (Both episodes 2 secs).  Patient has been off of his Coreg.  He is back on at present.  Normal Device function.  Patient reports have "sick feeling" in chest and stomach intermittent for a couple of days. Nauseated. Very weak. NTG taken and relieved symptoms after about 10-75mins.  This morning same symptoms, - BP 158/90, which is high for him.  States this is the same feeling he had with last heart attach.  Patient informed nothing to correlate with sx's on his transmission and that device will not see heart attacks and he should call 911 if symptoms severe now or in the least, if not actively symptomatic, should be taken to the ER. Wife present and both verbalizes understanding.

## 2022-12-08 NOTE — Telephone Encounter (Signed)
Pt spouse called in stating that pt blood pressure has been high and he just got a flu shot. Pt also states he hasn't been able to get his coreg which is an issue. Pt still wants to send in a transmission just to rule out he is not having a heart attack. I let pt know once we get transmission we will give a call back if this is more of pcp issue

## 2022-12-09 NOTE — Telephone Encounter (Signed)
Patient called back to advise he did not have a heart attack and he just wanted to give [place] a follow up call. I thanked patient for calling us back.

## 2022-12-09 NOTE — Telephone Encounter (Signed)
Pt went to [facility] and wants a call back to discuss this

## 2022-12-09 NOTE — Telephone Encounter (Signed)
He needs to take his coreg.

## 2023-01-01 ENCOUNTER — Telehealth: Payer: Self-pay | Admitting: Internal Medicine

## 2023-01-01 NOTE — Telephone Encounter (Signed)
Pt c/o medication issue:  1. Name of Medication:   carvedilol (COREG CR) 20 MG 24 hr capsule   2. How are you currently taking this medication (dosage and times per day)?   3. Are you having a reaction (difficulty breathing--STAT)?   4. What is your medication issue?   Patient stated he will be faxing over documentation to get medication application renewed.

## 2023-01-21 ENCOUNTER — Ambulatory Visit (INDEPENDENT_AMBULATORY_CARE_PROVIDER_SITE_OTHER): Payer: Medicare PPO

## 2023-01-21 DIAGNOSIS — I442 Atrioventricular block, complete: Secondary | ICD-10-CM | POA: Diagnosis not present

## 2023-01-21 LAB — CUP PACEART REMOTE DEVICE CHECK
Battery Remaining Longevity: 104 mo
Battery Voltage: 2.99 V
Brady Statistic AP VP Percent: 0.02 %
Brady Statistic AP VS Percent: 41.81 %
Brady Statistic AS VP Percent: 0 %
Brady Statistic AS VS Percent: 58.17 %
Brady Statistic RA Percent Paced: 41.09 %
Brady Statistic RV Percent Paced: 0.02 %
Date Time Interrogation Session: 20250107223016
Implantable Lead Connection Status: 753985
Implantable Lead Connection Status: 753985
Implantable Lead Implant Date: 20190110
Implantable Lead Implant Date: 20190110
Implantable Lead Location: 753860
Implantable Lead Location: 753860
Implantable Lead Model: 3830
Implantable Lead Model: 5076
Implantable Pulse Generator Implant Date: 20190110
Lead Channel Impedance Value: 361 Ohm
Lead Channel Impedance Value: 475 Ohm
Lead Channel Impedance Value: 513 Ohm
Lead Channel Impedance Value: 532 Ohm
Lead Channel Pacing Threshold Amplitude: 0.875 V
Lead Channel Pacing Threshold Pulse Width: 0.4 ms
Lead Channel Sensing Intrinsic Amplitude: 13.75 mV
Lead Channel Sensing Intrinsic Amplitude: 13.75 mV
Lead Channel Sensing Intrinsic Amplitude: 9.375 mV
Lead Channel Sensing Intrinsic Amplitude: 9.375 mV
Lead Channel Setting Pacing Amplitude: 2 V
Lead Channel Setting Pacing Amplitude: 2 V
Lead Channel Setting Pacing Pulse Width: 0.4 ms
Lead Channel Setting Sensing Sensitivity: 0.9 mV
Zone Setting Status: 755011
Zone Setting Status: 755011

## 2023-03-04 NOTE — Progress Notes (Signed)
 Remote pacemaker transmission.

## 2023-03-26 ENCOUNTER — Telehealth: Payer: Self-pay | Admitting: Internal Medicine

## 2023-03-26 NOTE — Telephone Encounter (Signed)
 Pt c/o medication issue:  1. Name of Medication: carvedilol (COREG CR) 20 MG 24 hr capsule   2. How are you currently taking this medication (dosage and times per day)? As directed  3. Are you having a reaction (difficulty breathing--STAT)? No   4. What is your medication issue? Pharmacy is wanting to switching to another generic and patient was told by PCP to call Dr Ladona Ridgel, Please advise

## 2023-03-26 NOTE — Telephone Encounter (Signed)
 Spoke to Pt and his wife. There has been some trouble getting his medication but they are able to get it from [facility]. There was talk about his PCP contacting DrTaylor about possible medication changes. Pt currently has 23 days of medication. Stated I would speak to Dr Ladona Ridgel before sending in medication to make sure there is no change.

## 2023-03-31 MED ORDER — CARVEDILOL 25 MG PO TABS: 25.0000 mg | ORAL_TABLET | Freq: Two times a day (BID) | ORAL | 3 refills | Status: AC

## 2023-03-31 NOTE — Telephone Encounter (Signed)
 Spoke with patient, Coreg CR 40 mg daily no longer covered by insurance - changing to carvedilol 25 mg twice daily per Dr Ladona Ridgel in clinic. Patient agrees with plan and knows this should be taken twice daily now because this is not extended release. [facility] [place] at [facility] request

## 2023-04-22 ENCOUNTER — Ambulatory Visit (INDEPENDENT_AMBULATORY_CARE_PROVIDER_SITE_OTHER): Payer: Medicare PPO

## 2023-04-22 DIAGNOSIS — I442 Atrioventricular block, complete: Secondary | ICD-10-CM

## 2023-04-22 LAB — CUP PACEART REMOTE DEVICE CHECK
Battery Remaining Longevity: 102 mo
Battery Voltage: 2.99 V
Brady Statistic AP VP Percent: 0.02 %
Brady Statistic AP VS Percent: 39.84 %
Brady Statistic AS VP Percent: 0 %
Brady Statistic AS VS Percent: 60.13 %
Brady Statistic RA Percent Paced: 39.15 %
Brady Statistic RV Percent Paced: 0.03 %
Date Time Interrogation Session: 20250408223521
Implantable Lead Connection Status: 753985
Implantable Lead Connection Status: 753985
Implantable Lead Implant Date: 20190110
Implantable Lead Implant Date: 20190110
Implantable Lead Location: 753860
Implantable Lead Location: 753860
Implantable Lead Model: 3830
Implantable Lead Model: 5076
Implantable Pulse Generator Implant Date: 20190110
Lead Channel Impedance Value: 399 Ohm
Lead Channel Impedance Value: 551 Ohm
Lead Channel Impedance Value: 570 Ohm
Lead Channel Impedance Value: 608 Ohm
Lead Channel Pacing Threshold Amplitude: 0.875 V
Lead Channel Pacing Threshold Pulse Width: 0.4 ms
Lead Channel Sensing Intrinsic Amplitude: 14.125 mV
Lead Channel Sensing Intrinsic Amplitude: 14.125 mV
Lead Channel Sensing Intrinsic Amplitude: 19.5 mV
Lead Channel Sensing Intrinsic Amplitude: 19.5 mV
Lead Channel Setting Pacing Amplitude: 2 V
Lead Channel Setting Pacing Amplitude: 2 V
Lead Channel Setting Pacing Pulse Width: 0.4 ms
Lead Channel Setting Sensing Sensitivity: 0.9 mV
Zone Setting Status: 755011
Zone Setting Status: 755011

## 2023-04-28 ENCOUNTER — Encounter: Payer: Self-pay | Admitting: Internal Medicine

## 2023-06-05 NOTE — Progress Notes (Signed)
 Remote pacemaker transmission.

## 2023-07-22 ENCOUNTER — Ambulatory Visit: Payer: Medicare PPO

## 2023-07-22 DIAGNOSIS — I442 Atrioventricular block, complete: Secondary | ICD-10-CM

## 2023-07-23 ENCOUNTER — Ambulatory Visit: Payer: Self-pay | Admitting: Internal Medicine

## 2023-07-23 LAB — CUP PACEART REMOTE DEVICE CHECK
Battery Remaining Longevity: 96 mo
Battery Voltage: 2.98 V
Brady Statistic AP VP Percent: 0.02 %
Brady Statistic AP VS Percent: 41.96 %
Brady Statistic AS VP Percent: 0 %
Brady Statistic AS VS Percent: 58.02 %
Brady Statistic RA Percent Paced: 41.26 %
Brady Statistic RV Percent Paced: 0.02 %
Date Time Interrogation Session: 20250708232033
Implantable Lead Connection Status: 753985
Implantable Lead Connection Status: 753985
Implantable Lead Implant Date: 20190110
Implantable Lead Implant Date: 20190110
Implantable Lead Location: 753860
Implantable Lead Location: 753860
Implantable Lead Model: 3830
Implantable Lead Model: 5076
Implantable Pulse Generator Implant Date: 20190110
Lead Channel Impedance Value: 342 Ohm
Lead Channel Impedance Value: 418 Ohm
Lead Channel Impedance Value: 475 Ohm
Lead Channel Impedance Value: 494 Ohm
Lead Channel Pacing Threshold Amplitude: 0.875 V
Lead Channel Pacing Threshold Pulse Width: 0.4 ms
Lead Channel Sensing Intrinsic Amplitude: 11.25 mV
Lead Channel Sensing Intrinsic Amplitude: 11.25 mV
Lead Channel Sensing Intrinsic Amplitude: 7.75 mV
Lead Channel Sensing Intrinsic Amplitude: 7.75 mV
Lead Channel Setting Pacing Amplitude: 2 V
Lead Channel Setting Pacing Amplitude: 2 V
Lead Channel Setting Pacing Pulse Width: 0.4 ms
Lead Channel Setting Sensing Sensitivity: 0.9 mV
Zone Setting Status: 755011
Zone Setting Status: 755011

## 2023-09-09 ENCOUNTER — Ambulatory Visit: Attending: Cardiovascular Disease | Admitting: Internal Medicine

## 2023-09-09 ENCOUNTER — Encounter: Payer: Self-pay | Admitting: Internal Medicine

## 2023-09-09 VITALS — BP 132/60 | HR 74 | Ht 70.0 in | Wt 231.6 lb

## 2023-09-09 DIAGNOSIS — I4891 Unspecified atrial fibrillation: Secondary | ICD-10-CM

## 2023-09-09 DIAGNOSIS — I442 Atrioventricular block, complete: Secondary | ICD-10-CM

## 2023-09-09 DIAGNOSIS — Z950 Presence of cardiac pacemaker: Secondary | ICD-10-CM | POA: Diagnosis not present

## 2023-09-09 LAB — CUP PACEART INCLINIC DEVICE CHECK
Date Time Interrogation Session: 20250827161024
Implantable Lead Connection Status: 753985
Implantable Lead Connection Status: 753985
Implantable Lead Implant Date: 20190110
Implantable Lead Implant Date: 20190110
Implantable Lead Location: 753860
Implantable Lead Location: 753860
Implantable Lead Model: 3830
Implantable Lead Model: 5076
Implantable Pulse Generator Implant Date: 20190110

## 2023-09-09 NOTE — Progress Notes (Signed)
 HPI [name] [name] returns today for followup. He is a pleasant 73 yo man with chronic atrial fib and symptomatic tachy-brady syndrome with syncope who underwent PPM insertion in Jan. 2019 due to long symptomatic pauses. The patient has done well in the interim. He has diastolic heart failure. He denies palpitations. He has previously had fairly difficult to control HTN. He has tried to be more careful with his salt intake though he admits to eating too much during Covid. He denies palpitations. When I saw him last he c/o some dyspnea and I asked him to undergo a 2D echo and he had very mild LV dysfunction. He feels better since his last visit. No edema.  Allergies  Allergen Reactions   Digoxin And Related Other (See Comments)    unknown   Diltiazem Hcl     Generalized Edema (intolerance)    Metoprolol  Other (See Comments)    Fluid retention   Ouabain     Generalized Edema (intolerance)    Pantoprazole Sodium Other (See Comments)    Stomach pain   Propafenone Hcl Er Other (See Comments)    H/A, N/V, stomach upset     Current Outpatient Medications  Medication Sig Dispense Refill   aspirin  81 MG EC tablet Take 81 mg by mouth daily.     carvedilol  (COREG ) 25 MG tablet Take 1 tablet (25 mg total) by mouth 2 (two) times daily. 180 tablet 3   fluticasone (FLONASE) 50 MCG/ACT nasal spray Place 2 sprays into both nostrils daily as needed. ALLERGIES     furosemide  (LASIX ) 40 MG tablet Take 40 mg by mouth daily as needed (swelling).      hydrALAZINE  (APRESOLINE ) 25 MG tablet Take 25 mg by mouth daily.     hydrochlorothiazide  (HYDRODIURIL ) 25 MG tablet Take 25 mg by mouth daily.     hydrocortisone (ANUSOL-HC) 2.5 % rectal cream as needed.     isosorbide mononitrate (IMDUR) 30 MG 24 hr tablet Take 30 mg by mouth daily.     losartan  (COZAAR ) 100 MG tablet Take 1 tablet by mouth daily.     nitroGLYCERIN  (NITROSTAT ) 0.4 MG SL tablet Place 1 tablet (0.4 mg total) under the tongue as needed.  30 tablet 3   pantoprazole (PROTONIX) 40 MG tablet daily.     potassium chloride  SA (K-DUR,KLOR-CON ) 20 MEQ tablet Take 20 mEq by mouth daily.     RABEprazole (ACIPHEX) 20 MG tablet Take 20 mg by mouth daily.      No current facility-administered medications for this visit.     Past Medical History:  Diagnosis Date   Arthritis    all (01/22/2017)   ATRIAL FIBRILLATION 06/26/2008   Qualifier: Diagnosis of  By: Waddell, MD, [facility], [name] [name]    BRADYCARDIA 06/06/2008   Qualifier: Diagnosis of  By: Rolan Hough     CAD (coronary artery disease), native coronary artery    a. Cath 02/2018 @ [facility] - 50-70% ostial Lcx; 50% diagonal; 25% ostial LAD; 25% mid LAD; 75% dLAD; 75% dRCA >>> medical therapy   CARDIOMYOPATHY, SECONDARY 06/06/2008   Qualifier: Diagnosis of  By: Rolan Hough     chronic diastolic CHF    Essential hypertension 06/06/2008   Qualifier: Diagnosis of  By: Rolan Hough     GERD (gastroesophageal reflux disease)    Heart murmur    High cholesterol    Migraine    none in the 2000s (01/22/2017)   PONV (postoperative nausea and vomiting)  Presence of permanent cardiac pacemaker    Presence of Watchman left atrial appendage closure device 11/22/2020   27 mm Watchman FLX with Dr. 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   Past Surgical History:  Procedure Laterality Date   ATRIAL FIBRILLATION ABLATION     CARDIAC CATHETERIZATION     CARDIOVERSION  06/19/2005   DC cardioversion /notes 05/28/2010   INSERT / REPLACE / REMOVE PACEMAKER  01/22/2017   LEFT ATRIAL APPENDAGE OCCLUSION N/A 11/22/2020   Procedure: LEFT ATRIAL APPENDAGE OCCLUSION;  Surgeon: [name] Ole DASEN, MD;  Location: [facility] INVASIVE CV LAB;  Service:  Cardiovascular;  Laterality: N/A;   PACEMAKER IMPLANT N/A 01/22/2017   Procedure: PACEMAKER IMPLANT;  Surgeon: [name] [name] ORN, MD;  Location: [facility] INVASIVE CV LAB;  Service: Cardiovascular;  Laterality: N/A;   SHOULDER ARTHROSCOPY WITH ROTATOR CUFF REPAIR Left ~ 2015   TEE WITHOUT CARDIOVERSION N/A 11/22/2020   Procedure: TRANSESOPHAGEAL ECHOCARDIOGRAM (TEE);  Surgeon: [name] Ole DASEN, MD;  Location: [facility] INVASIVE CV LAB;  Service: Cardiovascular;  Laterality: N/A;     No family history on file.   Social History   Socioeconomic History   Marital status: Married    Spouse name: Not on file   Number of children: Not on file   Years of education: Not on file   Highest education level: Not on file  Occupational History   Not on file  Tobacco Use   Smoking status: Former    Current packs/day: 0.00    Average packs/day: 0.3 packs/day for 7.0 years (2.3 ttl pk-yrs)    Types: Cigarettes    Start date: 41    Quit date: 52    Years since quitting: 50.6   Smokeless tobacco: Never  Vaping Use   Vaping status: Never Used  Substance and Sexual Activity   Alcohol use: No   Drug use: No   Sexual activity: Not Currently  Other Topics Concern   Not on file  Social History Narrative   Not on file   Social Drivers of Health   Financial Resource Strain: Low Risk  (07/03/2020)   Received from [facility]   Overall Financial Resource Strain (CARDIA)    Difficulty of Paying Living Expenses: Not hard at all  Food Insecurity: No Food Insecurity (01/15/2021)   Received from [facility]   Hunger Vital Sign    Within the past 12 months, you worried that your food would run out before you got the money to buy more.: Never true    Within the past 12 months, the food you bought just didn't last and you didn't have money to get more.: Never true  Transportation Needs: No Transportation Needs (07/03/2020)   Received from [facility] - Transportation    Lack of Transportation  (Medical): No    Lack of Transportation (Non-Medical): No  Physical Activity: Sufficiently Active (07/03/2020)   Received from [facility]   Exercise Vital Sign    On average, how many days per week do you engage in moderate to strenuous exercise (like a brisk walk)?: 3 days    On average, how many minutes do you engage in exercise at this level?: 60 min  Stress: No Stress Concern  Present (07/03/2020)   Received from [facility] - Occupational Stress Questionnaire    Feeling of Stress : Only a little  Social Connections: Unknown (05/13/2021)   Received from [facility]   Social Network    Social Network: Not on file  Intimate Partner Violence: Unknown (04/16/2021)   Received from [facility]   HITS    Physically Hurt: Not on file    Insult or Talk Down To: Not on file    Threaten Physical Harm: Not on file    Scream or Curse: Not on file     There were no vitals taken for this visit.  Physical Exam:  Well appearing NAD HEENT: Unremarkable Neck:  No JVD, no thyromegally Lymphatics:  No adenopathy Back:  No CVA tenderness Lungs:  Clear HEART:  Regular rate rhythm, no murmurs, no rubs, no clicks Abd:  soft, positive bowel sounds, no organomegally, no rebound, no guarding Ext:  2 plus pulses, no edema, no cyanosis, no clubbing Skin:  No rashes no nodules Neuro:  CN II through XII intact, motor grossly intact  EKG - afib with intermittent ventricular pacing.   DEVICE  Normal device function.  See PaceArt for details.   Assess/Plan:  Atrial fib - his rates are controlled. He is pacing about 42% of the time. He has undergone Watchman and is off of systemic anti-coagulation.  2. 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  4. Diastolic heart failure - his symptoms are class 2. He will continue his current meds. His  EF is down slightly. He is feeling better. Might consider adding entresto and stopping losartan  in the future.   [name] Gaius Ishaq,MD

## 2023-09-09 NOTE — Patient Instructions (Signed)

## 2023-10-15 ENCOUNTER — Encounter: Admitting: Internal Medicine

## 2023-10-21 ENCOUNTER — Ambulatory Visit

## 2023-10-21 DIAGNOSIS — I442 Atrioventricular block, complete: Secondary | ICD-10-CM

## 2023-10-22 LAB — CUP PACEART REMOTE DEVICE CHECK
Battery Remaining Longevity: 93 mo
Battery Voltage: 2.98 V
Brady Statistic AP VP Percent: 0 %
Brady Statistic AP VS Percent: 42.91 %
Brady Statistic AS VP Percent: 0 %
Brady Statistic AS VS Percent: 57.09 %
Brady Statistic RA Percent Paced: 42.18 %
Brady Statistic RV Percent Paced: 0 %
Date Time Interrogation Session: 20251008044437
Implantable Lead Connection Status: 753985
Implantable Lead Connection Status: 753985
Implantable Lead Implant Date: 20190110
Implantable Lead Implant Date: 20190110
Implantable Lead Location: 753860
Implantable Lead Location: 753860
Implantable Lead Model: 3830
Implantable Lead Model: 5076
Implantable Pulse Generator Implant Date: 20190110
Lead Channel Impedance Value: 361 Ohm
Lead Channel Impedance Value: 456 Ohm
Lead Channel Impedance Value: 513 Ohm
Lead Channel Impedance Value: 513 Ohm
Lead Channel Pacing Threshold Amplitude: 0.875 V
Lead Channel Pacing Threshold Pulse Width: 0.4 ms
Lead Channel Sensing Intrinsic Amplitude: 13.625 mV
Lead Channel Sensing Intrinsic Amplitude: 13.625 mV
Lead Channel Sensing Intrinsic Amplitude: 8.5 mV
Lead Channel Sensing Intrinsic Amplitude: 8.5 mV
Lead Channel Setting Pacing Amplitude: 2 V
Lead Channel Setting Pacing Amplitude: 2 V
Lead Channel Setting Pacing Pulse Width: 0.4 ms
Lead Channel Setting Sensing Sensitivity: 0.9 mV
Zone Setting Status: 755011
Zone Setting Status: 755011

## 2023-10-23 NOTE — Progress Notes (Signed)
 Remote PPM Transmission

## 2023-10-26 NOTE — Progress Notes (Signed)
 Remote PPM Transmission

## 2023-10-28 ENCOUNTER — Ambulatory Visit: Payer: Self-pay | Admitting: Internal Medicine

## 2024-01-20 ENCOUNTER — Ambulatory Visit

## 2024-01-20 DIAGNOSIS — I442 Atrioventricular block, complete: Secondary | ICD-10-CM | POA: Diagnosis not present

## 2024-01-21 LAB — CUP PACEART REMOTE DEVICE CHECK
Battery Remaining Longevity: 90 mo
Battery Voltage: 2.98 V
Brady Statistic AP VP Percent: 0 %
Brady Statistic AP VS Percent: 34.7 %
Brady Statistic AS VP Percent: 0 %
Brady Statistic AS VS Percent: 65.3 %
Brady Statistic RA Percent Paced: 34 %
Brady Statistic RV Percent Paced: 0 %
Date Time Interrogation Session: 20260107000416
Implantable Lead Connection Status: 753985
Implantable Lead Connection Status: 753985
Implantable Lead Implant Date: 20190110
Implantable Lead Implant Date: 20190110
Implantable Lead Location: 753860
Implantable Lead Location: 753860
Implantable Lead Model: 3830
Implantable Lead Model: 5076
Implantable Pulse Generator Implant Date: 20190110
Lead Channel Impedance Value: 361 Ohm
Lead Channel Impedance Value: 513 Ohm
Lead Channel Impedance Value: 513 Ohm
Lead Channel Impedance Value: 570 Ohm
Lead Channel Pacing Threshold Amplitude: 0.875 V
Lead Channel Pacing Threshold Pulse Width: 0.4 ms
Lead Channel Sensing Intrinsic Amplitude: 12.125 mV
Lead Channel Sensing Intrinsic Amplitude: 12.125 mV
Lead Channel Sensing Intrinsic Amplitude: 9.25 mV
Lead Channel Sensing Intrinsic Amplitude: 9.25 mV
Lead Channel Setting Pacing Amplitude: 2 V
Lead Channel Setting Pacing Amplitude: 2 V
Lead Channel Setting Pacing Pulse Width: 0.4 ms
Lead Channel Setting Sensing Sensitivity: 0.9 mV
Zone Setting Status: 755011
Zone Setting Status: 755011

## 2024-01-23 ENCOUNTER — Ambulatory Visit: Payer: Self-pay | Admitting: Cardiovascular Disease

## 2024-01-25 NOTE — Progress Notes (Signed)
 Remote PPM Transmission

## 2024-04-20 ENCOUNTER — Encounter

## 2024-07-20 ENCOUNTER — Encounter

## 2024-10-19 ENCOUNTER — Encounter
